# Patient Record
Sex: Male | Born: 1950 | Race: White | Hispanic: No | Smoking: Never smoker
Health system: Southern US, Community
[De-identification: ages and names within clinical notes are randomized; demographics above are authoritative.]

## PROBLEM LIST (undated history)

## (undated) DIAGNOSIS — I10 Essential (primary) hypertension: Secondary | ICD-10-CM

## (undated) DIAGNOSIS — Z951 Presence of aortocoronary bypass graft: Secondary | ICD-10-CM

## (undated) DIAGNOSIS — Z950 Presence of cardiac pacemaker: Secondary | ICD-10-CM

## (undated) DIAGNOSIS — I2510 Atherosclerotic heart disease of native coronary artery without angina pectoris: Secondary | ICD-10-CM

## (undated) HISTORY — PX: HAND SURGERY: SHX662

## (undated) HISTORY — PX: CORONARY ARTERY BYPASS GRAFT: SHX141

## (undated) HISTORY — PX: FOOT SURGERY: SHX648

---

## 2014-11-23 DIAGNOSIS — I10 Essential (primary) hypertension: Secondary | ICD-10-CM | POA: Diagnosis present

## 2014-11-23 DIAGNOSIS — I330 Acute and subacute infective endocarditis: Secondary | ICD-10-CM | POA: Insufficient documentation

## 2014-11-23 HISTORY — DX: Acute and subacute infective endocarditis: I33.0

## 2014-11-24 DIAGNOSIS — I5032 Chronic diastolic (congestive) heart failure: Secondary | ICD-10-CM | POA: Diagnosis present

## 2018-08-29 DIAGNOSIS — M009 Pyogenic arthritis, unspecified: Secondary | ICD-10-CM | POA: Insufficient documentation

## 2018-08-29 HISTORY — DX: Pyogenic arthritis, unspecified: M00.9

## 2018-11-21 DIAGNOSIS — Z950 Presence of cardiac pacemaker: Secondary | ICD-10-CM | POA: Diagnosis present

## 2019-04-23 DIAGNOSIS — E782 Mixed hyperlipidemia: Secondary | ICD-10-CM | POA: Diagnosis present

## 2021-02-01 DIAGNOSIS — I442 Atrioventricular block, complete: Secondary | ICD-10-CM | POA: Insufficient documentation

## 2021-05-16 ENCOUNTER — Observation Stay (HOSPITAL_COMMUNITY)
Admission: EM | Admit: 2021-05-16 | Discharge: 2021-05-18 | Disposition: A | Discharge status: Home / Self Care | Payer: Medicare Other | Attending: Internal Medicine | Admitting: Internal Medicine

## 2021-05-16 ENCOUNTER — Emergency Department (HOSPITAL_COMMUNITY): Payer: Medicare Other

## 2021-05-16 ENCOUNTER — Encounter (HOSPITAL_COMMUNITY): Payer: Self-pay

## 2021-05-16 ENCOUNTER — Other Ambulatory Visit: Payer: Self-pay

## 2021-05-16 DIAGNOSIS — Z8616 Personal history of COVID-19: Secondary | ICD-10-CM | POA: Insufficient documentation

## 2021-05-16 DIAGNOSIS — E782 Mixed hyperlipidemia: Secondary | ICD-10-CM | POA: Insufficient documentation

## 2021-05-16 DIAGNOSIS — Z950 Presence of cardiac pacemaker: Secondary | ICD-10-CM | POA: Insufficient documentation

## 2021-05-16 DIAGNOSIS — Z951 Presence of aortocoronary bypass graft: Secondary | ICD-10-CM | POA: Insufficient documentation

## 2021-05-16 DIAGNOSIS — Z6821 Body mass index (BMI) 21.0-21.9, adult: Secondary | ICD-10-CM | POA: Insufficient documentation

## 2021-05-16 DIAGNOSIS — I2510 Atherosclerotic heart disease of native coronary artery without angina pectoris: Secondary | ICD-10-CM | POA: Insufficient documentation

## 2021-05-16 DIAGNOSIS — R634 Abnormal weight loss: Secondary | ICD-10-CM | POA: Diagnosis not present

## 2021-05-16 DIAGNOSIS — N179 Acute kidney failure, unspecified: Secondary | ICD-10-CM | POA: Insufficient documentation

## 2021-05-16 DIAGNOSIS — E785 Hyperlipidemia, unspecified: Secondary | ICD-10-CM | POA: Insufficient documentation

## 2021-05-16 DIAGNOSIS — I5032 Chronic diastolic (congestive) heart failure: Secondary | ICD-10-CM | POA: Insufficient documentation

## 2021-05-16 DIAGNOSIS — I110 Hypertensive heart disease with heart failure: Secondary | ICD-10-CM | POA: Diagnosis not present

## 2021-05-16 DIAGNOSIS — R0989 Other specified symptoms and signs involving the circulatory and respiratory systems: Secondary | ICD-10-CM | POA: Insufficient documentation

## 2021-05-16 DIAGNOSIS — R0902 Hypoxemia: Secondary | ICD-10-CM | POA: Diagnosis not present

## 2021-05-16 DIAGNOSIS — R079 Chest pain, unspecified: Secondary | ICD-10-CM | POA: Diagnosis not present

## 2021-05-16 DIAGNOSIS — I080 Rheumatic disorders of both mitral and aortic valves: Secondary | ICD-10-CM | POA: Diagnosis not present

## 2021-05-16 DIAGNOSIS — R053 Chronic cough: Secondary | ICD-10-CM | POA: Insufficient documentation

## 2021-05-16 DIAGNOSIS — R9431 Abnormal electrocardiogram [ECG] [EKG]: Secondary | ICD-10-CM | POA: Insufficient documentation

## 2021-05-16 DIAGNOSIS — Z7982 Long term (current) use of aspirin: Secondary | ICD-10-CM | POA: Diagnosis not present

## 2021-05-16 DIAGNOSIS — I10 Essential (primary) hypertension: Secondary | ICD-10-CM | POA: Diagnosis present

## 2021-05-16 DIAGNOSIS — J984 Other disorders of lung: Secondary | ICD-10-CM | POA: Insufficient documentation

## 2021-05-16 DIAGNOSIS — I700 Atherosclerosis of aorta: Secondary | ICD-10-CM | POA: Diagnosis not present

## 2021-05-16 DIAGNOSIS — J129 Viral pneumonia, unspecified: Secondary | ICD-10-CM | POA: Diagnosis not present

## 2021-05-16 DIAGNOSIS — R0609 Other forms of dyspnea: Secondary | ICD-10-CM | POA: Diagnosis not present

## 2021-05-16 DIAGNOSIS — R001 Bradycardia, unspecified: Secondary | ICD-10-CM | POA: Diagnosis not present

## 2021-05-16 DIAGNOSIS — Z7901 Long term (current) use of anticoagulants: Secondary | ICD-10-CM | POA: Insufficient documentation

## 2021-05-16 DIAGNOSIS — Z79899 Other long term (current) drug therapy: Secondary | ICD-10-CM | POA: Insufficient documentation

## 2021-05-16 DIAGNOSIS — R0602 Shortness of breath: Secondary | ICD-10-CM | POA: Diagnosis present

## 2021-05-16 DIAGNOSIS — Z20822 Contact with and (suspected) exposure to covid-19: Secondary | ICD-10-CM | POA: Diagnosis not present

## 2021-05-16 DIAGNOSIS — J189 Pneumonia, unspecified organism: Secondary | ICD-10-CM | POA: Insufficient documentation

## 2021-05-16 HISTORY — DX: Presence of cardiac pacemaker: Z95.0

## 2021-05-16 HISTORY — DX: Essential (primary) hypertension: I10

## 2021-05-16 HISTORY — DX: Atherosclerotic heart disease of native coronary artery without angina pectoris: I25.10

## 2021-05-16 HISTORY — DX: Presence of aortocoronary bypass graft: Z95.1

## 2021-05-16 LAB — CBC
HCT: 44.3 % (ref 39.0–52.0)
Hemoglobin: 14.9 g/dL (ref 13.0–17.0)
MCH: 31 pg (ref 26.0–34.0)
MCHC: 33.6 g/dL (ref 30.0–36.0)
MCV: 92.3 fL (ref 80.0–100.0)
Platelets: 310 10*3/uL (ref 150–400)
RBC: 4.8 MIL/uL (ref 4.22–5.81)
RDW: 12.3 % (ref 11.5–15.5)
WBC: 14.1 10*3/uL — ABNORMAL HIGH (ref 4.0–10.5)
nRBC: 0 % (ref 0.0–0.2)

## 2021-05-16 LAB — COMPREHENSIVE METABOLIC PANEL
ALT: 14 U/L (ref 0–44)
AST: 21 U/L (ref 15–41)
Albumin: 3.7 g/dL (ref 3.5–5.0)
Alkaline Phosphatase: 112 U/L (ref 38–126)
Anion gap: 11 (ref 5–15)
BUN: 23 mg/dL (ref 8–23)
CO2: 18 mmol/L — ABNORMAL LOW (ref 22–32)
Calcium: 9.1 mg/dL (ref 8.9–10.3)
Chloride: 110 mmol/L (ref 98–111)
Creatinine, Ser: 1.69 mg/dL — ABNORMAL HIGH (ref 0.61–1.24)
GFR, Estimated: 43 mL/min — ABNORMAL LOW (ref >60)
Glucose, Bld: 103 mg/dL — ABNORMAL HIGH (ref 70–99)
Potassium: 4.6 mmol/L (ref 3.5–5.1)
Sodium: 139 mmol/L (ref 135–145)
Total Bilirubin: 1.1 mg/dL (ref 0.3–1.2)
Total Protein: 6.9 g/dL (ref 6.5–8.1)

## 2021-05-16 LAB — TROPONIN I (HIGH SENSITIVITY): Troponin I (High Sensitivity): 11 ng/L (ref <18)

## 2021-05-16 LAB — BRAIN NATRIURETIC PEPTIDE: B Natriuretic Peptide: 80.4 pg/mL (ref 0.0–100.0)

## 2021-05-16 NOTE — ED Triage Notes (Signed)
Pt reports sob for x1 month. Pt reports his sob is on & off. Pt reports 2 Cabg, pacemaker, 2 staph infections. Pt placed on 2L [place] in triage due to sp02 being 87% on RA ?

## 2021-05-16 NOTE — ED Provider Triage Note (Addendum)
Emergency Medicine Provider Triage Evaluation Note ? ?Ruben Donovan , a 71 y.o. male  was evaluated in triage.  Pt complains of SOB for the past 3-4 months that worsened today. Denies any chest pain or lightheadedness. Denies any leg swelling. H/o CABG 2 years ago. Never smoker. Has a pacemaker for bradycardia.  ? ?Review of Systems  ?Positive: SOB ?Negative: Chest pain, leg swelling ? ?Physical Exam  ?BP 124/89   Pulse 83   Temp 98.5 ?F (36.9 ?C) (Oral)   Resp (!) 30   Ht 5\' 5"  (1.651 m)   Wt 59.9 kg   SpO2 98%   BMI 21.97 kg/m?  ?Gen:   Awake, uncomfortable ?Resp:  Tachypnea, dropped down to 80%s with ambulation. Diminished at the bases.  ?MSK:   Moves extremities without difficulty  ?Other:   ? ?Medical Decision Making  ?Medically screening exam initiated at 9:53 PM.  Appropriate orders placed.  Sebastiaan Eichenauer was informed that the remainder of the evaluation will be completed by another provider, this initial triage assessment does not replace that evaluation, and the importance of remaining in the ED until their evaluation is complete. ? ?Labs and imaging ordered. The patient needs to roomed now.  ?  ?Sherrell Puller, PA-C ?05/16/21 2156 ? ?  ?Sherrell Puller, PA-C ?05/16/21 2157 ? ?  ?Sherrell Puller, PA-C ?05/16/21 2157 ? ?

## 2021-05-17 ENCOUNTER — Encounter (HOSPITAL_COMMUNITY): Payer: Self-pay | Admitting: Internal Medicine

## 2021-05-17 ENCOUNTER — Emergency Department (HOSPITAL_COMMUNITY): Payer: Medicare Other

## 2021-05-17 ENCOUNTER — Observation Stay (HOSPITAL_BASED_OUTPATIENT_CLINIC_OR_DEPARTMENT_OTHER): Payer: Medicare Other

## 2021-05-17 DIAGNOSIS — I5032 Chronic diastolic (congestive) heart failure: Secondary | ICD-10-CM

## 2021-05-17 DIAGNOSIS — R0609 Other forms of dyspnea: Secondary | ICD-10-CM

## 2021-05-17 DIAGNOSIS — Z950 Presence of cardiac pacemaker: Secondary | ICD-10-CM | POA: Diagnosis not present

## 2021-05-17 DIAGNOSIS — R0902 Hypoxemia: Secondary | ICD-10-CM

## 2021-05-17 DIAGNOSIS — I10 Essential (primary) hypertension: Secondary | ICD-10-CM

## 2021-05-17 DIAGNOSIS — N179 Acute kidney failure, unspecified: Secondary | ICD-10-CM

## 2021-05-17 DIAGNOSIS — Z951 Presence of aortocoronary bypass graft: Secondary | ICD-10-CM

## 2021-05-17 LAB — RESP PANEL BY RT-PCR (FLU A&B, COVID) ARPGX2
Influenza A by PCR: NEGATIVE
Influenza B by PCR: NEGATIVE
SARS Coronavirus 2 by RT PCR: NEGATIVE

## 2021-05-17 LAB — RESPIRATORY PANEL BY PCR

## 2021-05-17 LAB — ECHOCARDIOGRAM COMPLETE
AR max vel: 1.9 cm2
AV Area VTI: 2.05 cm2
AV Area mean vel: 1.88 cm2
AV Mean grad: 4 mmHg
AV Peak grad: 9.1 mmHg
Ao pk vel: 1.51 m/s
Area-P 1/2: 3.93 cm2
Height: 65 in
S' Lateral: 3.5 cm
Weight: 2112 oz

## 2021-05-17 LAB — TROPONIN I (HIGH SENSITIVITY): Troponin I (High Sensitivity): 11 ng/L (ref <18)

## 2021-05-17 MED ORDER — IOHEXOL 350 MG/ML SOLN
70.0000 mL | Freq: Once | INTRAVENOUS | Status: AC | PRN
  Administered 2021-05-17: 70 mL via INTRAVENOUS

## 2021-05-17 MED ORDER — ASPIRIN EC 81 MG PO TBEC
81.0000 mg | DELAYED_RELEASE_TABLET | Freq: Every day | ORAL | Status: DC
  Administered 2021-05-17 – 2021-05-18 (×2): 81 mg via ORAL
  Filled 2021-05-17 (×2): qty 1

## 2021-05-17 MED ORDER — AMLODIPINE BESYLATE 5 MG PO TABS
5.0000 mg | ORAL_TABLET | Freq: Every day | ORAL | Status: DC
  Administered 2021-05-17 – 2021-05-18 (×2): 5 mg via ORAL
  Filled 2021-05-17 (×2): qty 1

## 2021-05-17 MED ORDER — ACETAMINOPHEN 325 MG PO TABS
650.0000 mg | ORAL_TABLET | Freq: Four times a day (QID) | ORAL | Status: DC | PRN
Start: 2021-05-17 — End: 2021-05-18

## 2021-05-17 MED ORDER — ONDANSETRON HCL 4 MG/2ML IJ SOLN: 4.0000 mg | Freq: Four times a day (QID) | INTRAMUSCULAR | Status: DC | PRN

## 2021-05-17 MED ORDER — DOXYCYCLINE HYCLATE 100 MG PO TABS
100.0000 mg | ORAL_TABLET | Freq: Two times a day (BID) | ORAL | Status: DC
  Administered 2021-05-17 – 2021-05-18 (×3): 100 mg via ORAL
  Filled 2021-05-17 (×3): qty 1

## 2021-05-17 MED ORDER — ONDANSETRON HCL 4 MG PO TABS: 4.0000 mg | ORAL_TABLET | Freq: Four times a day (QID) | ORAL | Status: DC | PRN

## 2021-05-17 MED ORDER — HYDROXYZINE HCL 25 MG PO TABS
25.0000 mg | ORAL_TABLET | Freq: Every day | ORAL | Status: DC
Start: 2021-05-17 — End: 2021-05-18
  Administered 2021-05-17: 25 mg via ORAL
  Filled 2021-05-17: qty 1

## 2021-05-17 MED ORDER — SODIUM CHLORIDE 0.9 % IV SOLN
1.0000 g | Freq: Once | INTRAVENOUS | Status: AC
  Administered 2021-05-17: 1 g via INTRAVENOUS
  Filled 2021-05-17: qty 10

## 2021-05-17 MED ORDER — SODIUM CHLORIDE 0.9 % IV BOLUS
500.0000 mL | Freq: Once | INTRAVENOUS | Status: AC
  Administered 2021-05-17: 500 mL via INTRAVENOUS

## 2021-05-17 MED ORDER — IPRATROPIUM-ALBUTEROL 0.5-2.5 (3) MG/3ML IN SOLN
3.0000 mL | Freq: Four times a day (QID) | RESPIRATORY_TRACT | Status: DC
  Administered 2021-05-17: 3 mL via RESPIRATORY_TRACT
  Filled 2021-05-17: qty 3

## 2021-05-17 MED ORDER — HEPARIN SODIUM (PORCINE) 5000 UNIT/ML IJ SOLN
5000.0000 [IU] | Freq: Three times a day (TID) | INTRAMUSCULAR | Status: DC
  Administered 2021-05-17 – 2021-05-18 (×4): 5000 [IU] via SUBCUTANEOUS
  Filled 2021-05-17 (×4): qty 1

## 2021-05-17 MED ORDER — IPRATROPIUM-ALBUTEROL 0.5-2.5 (3) MG/3ML IN SOLN
3.0000 mL | Freq: Two times a day (BID) | RESPIRATORY_TRACT | Status: DC
  Administered 2021-05-17 – 2021-05-18 (×2): 3 mL via RESPIRATORY_TRACT
  Filled 2021-05-17 (×2): qty 3

## 2021-05-17 MED ORDER — LACTATED RINGERS IV SOLN: INTRAVENOUS | Status: DC

## 2021-05-17 MED ORDER — CARVEDILOL 12.5 MG PO TABS
12.5000 mg | ORAL_TABLET | Freq: Two times a day (BID) | ORAL | Status: DC
  Administered 2021-05-17 – 2021-05-18 (×3): 12.5 mg via ORAL
  Filled 2021-05-17: qty 4
  Filled 2021-05-17 (×2): qty 1

## 2021-05-17 MED ORDER — ACETAMINOPHEN 650 MG RE SUPP: 650.0000 mg | Freq: Four times a day (QID) | RECTAL | Status: DC | PRN

## 2021-05-17 MED ORDER — AZITHROMYCIN 500 MG IV SOLR
500.0000 mg | Freq: Once | INTRAVENOUS | Status: AC
  Administered 2021-05-17: 500 mg via INTRAVENOUS
  Filled 2021-05-17: qty 5

## 2021-05-17 MED ORDER — ATORVASTATIN CALCIUM 40 MG PO TABS
40.0000 mg | ORAL_TABLET | Freq: Every day | ORAL | Status: DC
  Administered 2021-05-17 – 2021-05-18 (×2): 40 mg via ORAL
  Filled 2021-05-17 (×2): qty 1

## 2021-05-17 MED ORDER — FOLIC ACID 1 MG PO TABS
1.0000 mg | ORAL_TABLET | Freq: Every day | ORAL | Status: DC
  Administered 2021-05-17 – 2021-05-18 (×2): 1 mg via ORAL
  Filled 2021-05-17 (×2): qty 1

## 2021-05-17 NOTE — Assessment & Plan Note (Signed)
Chronic. 

## 2021-05-17 NOTE — Assessment & Plan Note (Signed)
Check echo.  BNP is slightly up from his labs last month (but still within normal limits) but certainly does not have any pulmonary edema.  He has no JVD.  He is euvolemic.  He has no peripheral edema. ?

## 2021-05-17 NOTE — Subjective & Objective (Signed)
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  He has noticed increasing shortness of breath with even simple activities.  [place].  He got so short of breath that he had his friend [place] [facility]. ? ?While walking from the parking lot into the triage area, he dropped his ambulatory saturations down to 80%.  He was placed on 2 L of oxygen. ? ?He denies any chest pain or chest discomfort with walking.  He had a CABG 2 years ago.  He has never been a smoker.  He used to work for the [employer] in [employer]  He denies breathing any noxious fumes.  He is never worked with asbestos.  He does relate that he had COVID in August 2022. ? ?His most recent labs were from March 2023 ? ?05/13/2021 Glucose 82 mg/dL 70-99 mg/dL  ?  Bun 13 mg/dL 8-27 mg/dL  ?  Creatinine 1.18 mg/dL 0.76-1.27 mg/dL  ?  Egfr 66 mL/min/1.73 >59 mL/min/1.73  ?  BUN/creatinine Ratio 11 10-24  ?  Sodium 138 mmol/L 134-144 mmol/L  ?  Potassium 4.5 mmol/L 3.5-5.2 mmol/L  ?  Chloride 104 mmol/L 96-106 mmol/L  ?  Carbon Dioxide, Total 20 mmol/L 20-29 mmol/L  ?05/13/2021 B-type Natriuretic Peptide 34.9 pg/mL 0.0-100.0 pg/mL  ? ? ?05/13/2021 Wbc 7.3 x10e3/uL 3.4-10.8 x10e3/uL  ?  Rbc 5.16 x10e6/uL 4.14-5.80 x10e6/uL  ?  Hemoglobin 16.0 g/dL 13.0-17.7 g/dL  ?  Hematocrit 45.8 % 37.5-51.0 %  ?  Mcv 89 fL 79-97 fL  ?  Mch 31.0 pg 26.6-33.0 pg  ?  Mchc 34.9 g/dL 31.5-35.7 g/dL  ?  Rdw 11.9 % 11.6-15.4 %  ?  Platelets 282 x10e3/uL 150-450 x10e3/uL  ? ?Vital signs on admission to the ER, temperature 98.5 heart rate 83 blood pressure 124/89 satting 90% on 2 L. ? ?Labs showed a white  count of 14.1, hemoglobin 14.9, platelets of 310 ? ?Sodium 139 potassium 4.6 bicarb 18, BUN of 23, creatinine 1.69 ? ?BNP of 80 ? ?COVID-negative flu negative ? ?CTPA was negative for PE.  There is some groundglass opacities in the left lower lobe and the small airways and some similar clustered nodules in the right middle lobe.  Does not appear to be pneumonia.  On my read. ? ?EKG shows paced rhythm ? ?Due to the patient's dyspnea exertion and amatory hypoxia, [facility] hospitalist contacted for admission. ? ? ? ?

## 2021-05-17 NOTE — ED Provider Notes (Signed)
?[facility] ?Provider Note ? ? ?CSN: [id number] ?Arrival date & time: 05/16/21  2035 ? ?  ? ?History ? ?Chief Complaint  ?Patient presents with  ? Shortness of Breath  ? Cough  ? ? ?Ruben Donovan is a 71 y.o. male. ? ?Patient presents to the emergency department for evaluation of shortness of breath.  Patient reports that he had COVID in August and has had some residual symptoms.  He has been experiencing increased shortness of breath for the last several months.  In the last 24 hours, however, symptoms have significantly worsened.  Patient reports significant exertional shortness of breath without chest pain.  He does have a significant cardiac history including bypass surgery.  Patient reports persistent cough for the last several months, nonproductive.  No significant change in the cough. ? ? ?  ? ?Home Medications ?Prior to Admission medications   ?Medication Sig Start Date End Date Taking? Authorizing Provider  ?albuterol (VENTOLIN HFA) 108 (90 Base) MCG/ACT inhaler Inhale 2 puffs into the lungs every 4 (four) hours as needed for wheezing or shortness of breath. 05/13/21  Yes [provider]  ?amLODipine (NORVASC) 5 MG tablet Take 5 mg by mouth daily. 05/05/21  Yes [provider]  ?aspirin 81 MG EC tablet Take 81 mg by mouth daily.   Yes [provider]  ?atorvastatin (LIPITOR) 40 MG tablet Take 40 mg by mouth daily. 02/22/21  Yes [provider]  ?azelastine (ASTELIN) 0.1 % nasal spray Place 2 sprays into both nostrils 2 (two) times daily as needed for rhinitis or allergies. 02/01/21  Yes [provider]  ?carvedilol (COREG) 12.5 MG tablet Take 12.5 mg by mouth 2 (two) times daily. 05/10/21  Yes [provider]  ?Emollient (CERAVE EX) Apply 1 application. topically as needed (dry skin).   Yes [provider]  ?folic acid (FOLVITE) 1 MG tablet Take 1 mg by mouth daily. 03/08/21  Yes [provider]   ?hydrOXYzine (ATARAX) 25 MG tablet Take 25 mg by mouth at bedtime. 03/31/21  Yes [provider]  ?lisinopril (ZESTRIL) 30 MG tablet Take 30 mg by mouth daily. 03/17/21  Yes [provider]  ?triamcinolone cream (KENALOG) 0.1 % Apply 1 application. topically 2 (two) times daily as needed (rash).   Yes [provider]  ?   ? ?Allergies    ?Clonidine derivatives and Hydralazine   ? ?Review of Systems   ?Review of Systems  ?Respiratory:  Positive for cough and shortness of breath.   ? ?Physical Exam ?Updated Vital Signs ?BP (!) 142/62   Pulse 65   Temp 98.5 ?F (36.9 ?C) (Oral)   Resp (!) 24   Ht 5\' 5"  (1.651 m)   Wt 59.9 kg   SpO2 99%   BMI 21.97 kg/m?  ?Physical Exam ?Vitals and nursing note reviewed.  ?Constitutional:   ?   General: He is not in acute distress. ?   Appearance: He is well-developed.  ?HENT:  ?   Head: Normocephalic and atraumatic.  ?   Mouth/Throat:  ?   Mouth: Mucous membranes are moist.  ?Eyes:  ?   General: Vision grossly intact. Gaze aligned appropriately.  ?   Extraocular Movements: Extraocular movements intact.  ?   Conjunctiva/sclera: Conjunctivae normal.  ?Cardiovascular:  ?   Rate and Rhythm: Normal rate and regular rhythm.  ?   Pulses: Normal pulses.  ?   Heart sounds: Normal heart sounds, S1 normal and S2 normal. No  murmur heard. ?  No friction rub. No gallop.  ?Pulmonary:  ?   Effort: Tachypnea and accessory muscle usage present. No respiratory distress.  ?   Breath sounds: Normal breath sounds.  ?Abdominal:  ?   Palpations: Abdomen is soft.  ?   Tenderness: There is no abdominal tenderness. There is no guarding or rebound.  ?   Hernia: No hernia is present.  ?Musculoskeletal:     ?   General: No swelling.  ?   Cervical back: Full passive range of motion without pain, normal range of motion and neck supple. No pain with movement, spinous process tenderness or muscular tenderness. Normal range of motion.  ?   Right lower leg: No edema.  ?   Left lower leg: No  edema.  ?Skin: ?   General: Skin is warm and dry.  ?   Capillary Refill: Capillary refill takes less than 2 seconds.  ?   Findings: No ecchymosis, erythema, lesion or wound.  ?Neurological:  ?   Mental Status: He is alert and oriented to person, place, and time.  ?   GCS: GCS eye subscore is 4. GCS verbal subscore is 5. GCS motor subscore is 6.  ?   Cranial Nerves: Cranial nerves 2-12 are intact.  ?   Sensory: Sensation is intact.  ?   Motor: Motor function is intact. No weakness or abnormal muscle tone.  ?   Coordination: Coordination is intact.  ?Psychiatric:     ?   Mood and Affect: Mood normal.     ?   Speech: Speech normal.     ?   Behavior: Behavior normal.  ? ? ?ED Results / Procedures / Treatments   ?Labs ?(all labs ordered are listed, but only abnormal results are displayed) ?Labs Reviewed  ?CBC - Abnormal; Notable for the following components:  ?    Result Value  ? WBC 14.1 (*)   ? All other components within normal limits  ?COMPREHENSIVE METABOLIC PANEL - Abnormal; Notable for the following components:  ? CO2 18 (*)   ? Glucose, Bld 103 (*)   ? Creatinine, Ser 1.69 (*)   ? GFR, Estimated 43 (*)   ? All other components within normal limits  ?RESP PANEL BY RT-PCR (FLU A&B, COVID) [id number]  ?BRAIN NATRIURETIC PEPTIDE  ?TROPONIN I (HIGH SENSITIVITY)  ?TROPONIN I (HIGH SENSITIVITY)  ? ? ?EKG ?EKG Interpretation ? ?Date/Time:  Monday May 16 2021 21:39:01 EDT ?Ventricular Rate:  79 ?PR Interval:  154 ?QRS Duration: 166 ?QT Interval:  454 ?QTC Calculation: 520 ?R Axis:   228 ?Text Interpretation: Atrial-sensed ventricular-paced rhythm Abnormal ECG No previous ECGs available Confirmed by Gilda Crease [phone]) on 05/17/2021 12:29:55 AM ? ?Radiology ?DG Chest 2 View ? ?Result Date: 05/16/2021 ?CLINICAL DATA:  Chest pain EXAM: CHEST - 2 VIEW COMPARISON:  None. FINDINGS: Right-sided multi lead pacing device. Post sternotomy changes. No focal opacity, pleural effusion or pneumothorax. Normal cardiac size.  Symmetrical lower lung opacities likely reflect nipple shadows IMPRESSION: No active cardiopulmonary disease. Electronically Signed   By: Jasmine Pang M.D.   On: 05/16/2021 22:39  ? ?CT Angio Chest Pulmonary Embolism (PE) W or WO Contrast ? ?Result Date: 05/17/2021 ?CLINICAL DATA:  Pulmonary embolism (PE) suspected, high prob EXAM: CT ANGIOGRAPHY CHEST WITH CONTRAST TECHNIQUE: Multidetector CT imaging of the chest was performed using the standard protocol during bolus administration of intravenous contrast. Multiplanar CT image reconstructions and MIPs were obtained to evaluate the vascular anatomy. RADIATION  DOSE REDUCTION: This exam was performed according to the departmental dose-optimization program which includes automated exposure control, adjustment of the mA and/or kV according to patient size and/or use of iterative reconstruction technique. CONTRAST:  20mL OMNIPAQUE IOHEXOL 350 MG/ML SOLN COMPARISON:  None. FINDINGS: Cardiovascular: No filling defects in the pulmonary arteries to suggest pulmonary emboli. Pacer wires noted in the right heart. Heart is borderline in size. Prior CABG. Scattered aortic calcifications. No aneurysm. Mediastinum/Nodes: No mediastinal, hilar, or axillary adenopathy. Trachea and esophagus are unremarkable. Thyroid unremarkable. Lungs/Pleura: Clustered nodular airspace disease and ground-glass disease in the left lower lobe, likely small airways disease. Similar clustered nodules in the right middle lobe. No effusions. Upper Abdomen: Imaging into the upper abdomen demonstrates no acute findings. Musculoskeletal: Chest wall soft tissues are unremarkable. No acute bony abnormality. Review of the MIP images confirms the above findings. IMPRESSION: No evidence of pulmonary embolus. Clustered nodular densities and ground-glass densities in the left lower lobe and right middle lobe compatible with small airways disease. Prior CABG. Aortic Atherosclerosis (ICD10-I70.0). Electronically  Signed   By: Charlett Nose M.D.   On: 05/17/2021 02:42   ? ?Procedures ?Procedures  ? ? ?Medications Ordered in ED ?Medications  ?cefTRIAXone (ROCEPHIN) 1 g in sodium chloride 0.9 % 100 mL IVPB (has no administration in time rang

## 2021-05-17 NOTE — Assessment & Plan Note (Signed)
Stable.  Continue aspirin and beta-blocker.  Continue statin. ?

## 2021-05-17 NOTE — Assessment & Plan Note (Signed)
Gently hydrate him.  Hold ACE inhibitor. ?

## 2021-05-17 NOTE — H&P (Signed)
?History and Physical  ? ? ?Ruben Donovan GLO:[id number] DOB: May 24, 1950 DOA: 05/16/2021 ? ?DOS: the patient was seen and examined on 05/16/2021 ? ?PCP: Pcp, No  ? ?Patient coming from: Home ? ?I have personally briefly reviewed patient's old medical records in [place] ? 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  He has noticed increasing shortness of breath with even simple activities.  [place]  He got so short of breath that he had his friend [place] [facility]. ? ?While walking from the parking lot into the triage area, he dropped his ambulatory saturations down to 80%.  He was placed on 2 L of oxygen. ? ?He denies any chest pain or chest discomfort with walking.  He had a CABG 2 years ago.  He has never been a smoker.  He used to work for the [employer] in [employer]  He denies breathing any noxious fumes.  He is never worked with asbestos.  He does relate that he had COVID in August 2022. ? ?His most recent labs were from March 2023 ? ?05/13/2021 Glucose 82 mg/dL 70-99 mg/dL  ?  Bun 13 mg/dL 8-27 mg/dL  ?  Creatinine 1.18 mg/dL 0.76-1.27 mg/dL  ?  Egfr 66 mL/min/1.73 >59 mL/min/1.73  ?  BUN/creatinine Ratio 11 10-24  ?  Sodium 138 mmol/L 134-144 mmol/L  ?  Potassium 4.5 mmol/L 3.5-5.2 mmol/L  ?  Chloride 104 mmol/L 96-106 mmol/L  ?  Carbon Dioxide, Total 20 mmol/L 20-29 mmol/L  ?05/13/2021 B-type Natriuretic Peptide 34.9 pg/mL 0.0-100.0 pg/mL  ? ? ?05/13/2021 Wbc 7.3 x10e3/uL 3.4-10.8 x10e3/uL  ?  Rbc 5.16 x10e6/uL 4.14-5.80 x10e6/uL  ?  Hemoglobin 16.0 g/dL 13.0-17.7 g/dL  ?  Hematocrit 45.8 % 37.5-51.0 %  ?  Mcv 89 fL 79-97  fL  ?  Mch 31.0 pg 26.6-33.0 pg  ?  Mchc 34.9 g/dL 31.5-35.7 g/dL  ?  Rdw 11.9 % 11.6-15.4 %  ?  Platelets 282 x10e3/uL 150-450 x10e3/uL  ? ?Vital signs on admission to the ER, temperature 98.5 heart rate 83 blood pressure 124/89 satting 90% on 2 L. ? ?Labs showed a white count of 14.1, hemoglobin 14.9, platelets of 310 ? ?Sodium 139 potassium 4.6 bicarb 18, BUN of 23, creatinine 1.69 ? ?BNP of 80 ? ?COVID-negative flu negative ? ?CTPA was negative for PE.  There is some groundglass opacities in the left lower lobe and the small airways and some similar clustered nodules in the right middle lobe.  Does not appear to be pneumonia.  On my read. ? ?EKG shows paced rhythm ? ?Due to the patient's dyspnea exertion and amatory hypoxia, [facility] hospitalist contacted for admission. ? ? ?  ? ?ED Course: Noted to be hypoxic walk from the parking lot to triage.  Sats dropped to the 80s.  Placed on 2 L of oxygen.  Work-up of his dyspnea really unrevealing.  Mild AKI.  CTPA negative for pneumonia and PE. ? ?Review of Systems:  ?Review of Systems  ?Constitutional:  Positive for malaise/fatigue.  ?HENT: Negative.    ?Eyes: Negative.   ?Respiratory:  Positive for cough and shortness of breath.   ?  Consistent dyspnea on exertion.  Only episodic dyspnea at rest.  Occasionally productive of clear sputum.  Denies any hemoptysis.  No night sweats.  ?Cardiovascular:  Negative for chest pain, palpitations, orthopnea, leg swelling and PND.  ?Gastrointestinal: Negative.   ?Genitourinary: Negative.   ?Musculoskeletal: Negative.   ?Skin: Negative.   ?Neurological: Negative.   ?Endo/Heme/Allergies: Negative.   ?Psychiatric/Behavioral: Negative.    ?All other systems reviewed and are negative. ? ?Past Medical History:  ?Diagnosis Date  ? 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 CAD (coronary artery disease), native coronary artery   ? Hx of CABG   ? Hypertension   ? Pacemaker   ? Septic arthritis of left ankle ([place]) 08/29/2018  ? ? ?Past  Surgical History:  ?Procedure Laterality Date  ? CORONARY ARTERY BYPASS GRAFT    ? ? ? reports that he has never smoked. He has never used smokeless tobacco. He reports that he does not currently use alcohol. He reports that he does not use drugs. ? ?Allergies  ?Allergen Reactions  ? Clonidine Derivatives   ? Hydralazine   ? ? ?History reviewed. No pertinent family history. ? ?Prior to Admission medications   ?Medication Sig Start Date End Date Taking? Authorizing Provider  ?albuterol (VENTOLIN HFA) 108 (90 Base) MCG/ACT inhaler Inhale 2 puffs into the lungs every 4 (four) hours as needed for wheezing or shortness of breath. 05/13/21  Yes [provider]  ?amLODipine (NORVASC) 5 MG tablet Take 5 mg by mouth daily. 05/05/21  Yes [provider]  ?aspirin 81 MG EC tablet Take 81 mg by mouth daily.   Yes [provider]  ?atorvastatin (LIPITOR) 40 MG tablet Take 40 mg by mouth daily. 02/22/21  Yes [provider]  ?azelastine (ASTELIN) 0.1 % nasal spray Place 2 sprays into both nostrils 2 (two) times daily as needed for rhinitis or allergies. 02/01/21  Yes [provider]  ?carvedilol (COREG) 12.5 MG tablet Take 12.5 mg by mouth 2 (two) times daily. 05/10/21  Yes [provider]  ?Emollient (CERAVE EX) Apply 1 application. topically as needed (dry skin).   Yes [provider]  ?folic acid (FOLVITE) 1 MG tablet Take 1 mg by mouth daily. 03/08/21  Yes [provider]  ?hydrOXYzine (ATARAX) 25 MG tablet Take 25 mg by mouth at bedtime. 03/31/21  Yes [provider]  ?lisinopril (ZESTRIL) 30 MG tablet Take 30 mg by mouth daily. 03/17/21  Yes [provider]  ?triamcinolone cream (KENALOG) 0.1 % Apply 1 application. topically 2 (two) times daily as needed (rash).   Yes [provider]  ? ? ?Physical Exam: ?Vitals:  ? 05/17/21 0400 05/17/21 0415 05/17/21 0430 05/17/21 0445  ?BP: 126/71  (!) 142/74   ?Pulse: 66 65 65 65  ?Resp: 12 (!) 32  (!) 32 (!) 25  ?Temp:      ?TempSrc:      ?SpO2: 100% 100% 100% 100%  ?Weight:      ?Height:      ? ? ?Physical Exam ?Vitals and nursing note reviewed.  ?Constitutional:   ?   General: He is not in acute distress. ?   Appearance: Normal appearance. He is normal weight. He is not ill-appearing, toxic-appearing or diaphoretic.  ?HENT:  ?   Head: Normocephalic and atraumatic.  ?   Nose: Nose normal. No rhinorrhea.  ?Cardiovascular:  ?   Rate and Rhythm: Normal rate and regular rhythm.  ?   Pulses: Normal pulses.  ?   Heart  sounds: No murmur heard. ?Pulmonary:  ?   Effort: Pulmonary effort is normal. No tachypnea.  ?   Breath sounds: Examination of the right-upper field reveals wheezing. Examination of the left-upper field reveals wheezing. Wheezing present.  ?   Comments: .Scattered wheezes in the upper lobes.  No respiratory distress ?Abdominal:  ?   General: Abdomen is flat. Bowel sounds are normal. There is no distension.  ?   Palpations: Abdomen is soft.  ?   Tenderness: There is no abdominal tenderness. There is no guarding or rebound.  ?Musculoskeletal:  ?   Comments: Pacemaker right anterior chest wall.  ?Skin: ?   General: Skin is warm and dry.  ?   Capillary Refill: Capillary refill takes less than 2 seconds.  ?Neurological:  ?   General: No focal deficit present.  ?   Mental Status: He is alert and oriented to person, place, and time.  ?  ? ?Labs on Admission: I have personally reviewed following labs and imaging studies ? ?CBC: ?Recent Labs  ?Lab 05/16/21 ?2149  ?WBC 14.1*  ?HGB 14.9  ?HCT 44.3  ?MCV 92.3  ?PLT 310  ? ?Basic Metabolic Panel: ?Recent Labs  ?Lab 05/16/21 ?2157  ?NA 139  ?K 4.6  ?CL 110  ?CO2 18*  ?GLUCOSE 103*  ?BUN 23  ?CREATININE 1.69*  ?CALCIUM 9.1  ? ?GFR: ?Estimated Creatinine Clearance: 34.5 mL/min (A) (by C-G formula based on SCr of 1.69 mg/dL (H)). ?Liver Function Tests: ?Recent Labs  ?Lab 05/16/21 ?2157  ?AST 21  ?ALT 14  ?ALKPHOS 112  ?BILITOT 1.1  ?PROT 6.9  ?ALBUMIN 3.7  ? ?No  results for input(s): LIPASE, AMYLASE in the last 168 hours. ?No results for input(s): AMMONIA in the last 168 hours. ?Coagulation Profile: ?No results for input(s): INR, PROTIME in the last 168 hours. ?Card

## 2021-05-17 NOTE — Progress Notes (Signed)
Patient admitted earlier this morning.  H&P reviewed.  Patient seen and examined. ? ?S: ?Patient complains of dry cough.  Symptoms of shortness of breath especially with exertion have been ongoing for past at least 1 to 3 months.  Slight worsening noted recently.  Denies any fever or chills.  No chest pain.  No leg swelling.  Does mention weight loss of about 10 pounds in the last 2 weeks. ? ?O: ?Noted to be afebrile.  Other vital signs are stable.  He was saturating in the early 80s on room air at initial assessment.  Currently saturations are in the late 90s on 2 L of oxygen by nasal cannula. ? ?Coarse air entry at the bases bilaterally with few crackles on the left base.  No wheezing or rhonchi.  Normal effort at rest. ?S1-S2 is normal regular.  No S3-S4.  No rubs murmurs or bruit ?Abdomen is soft.  Nontender nondistended. ?No lower extremity edema noted. ? ?WBC noted to be 14.1.  Procalcitonin is pending.   COVID-19 test was negative.  Influenza PCR negative.   ? ?Respiratory viral panel interestingly positive for adenovirus. ? ?CT angiogram report reviewed. ? ?A/P: ?Reason for patient's symptoms is not entirely clear.  His somewhat subacute symptoms may have been made worse by acute adenovirus infection.  Follow-up on echocardiogram which has been ordered.  Hypersensitivity pneumonitis panel has been ordered.   ?PFTs have been ordered which should ideally be done in the outpatient setting.  The adenovirus infection may bias the results.  We will cancel the study for now.   ? ?There could be secondary bacterial infection.  We will place him on doxycycline.   ? ?Depending on results of echocardiogram and his clinical progress may be reasonable to involve pulmonology.  If he improves in the next 24 hours then this can be pursued in the outpatient setting. ? ?He may need home oxygen.  We will determine that in the next 24 to 48 hours. ? ?Other medical problems as outlined in the H&P.  He is being hydrated.  Recheck  labs tomorrow.  Monitor urine output. ? ?Ruben Donovan ?05/17/2021 ? ?

## 2021-05-17 NOTE — Progress Notes (Signed)
?  Transition of Care (TOC) Screening Note ? ? ?Patient Details  ?Name: Ruben Donovan ?Date of Birth: 09/10/1950 ? ? ?Transition of Care (TOC) CM/SW Contact:    ?Mearl Latin, LCSW ?Phone Number: ?05/17/2021, 5:02 PM ? ? ? ?Transition of Care Department [facility]) has reviewed patient and no TOC needs have been identified at this time. We will continue to monitor patient advancement through interdisciplinary progression rounds. If new patient transition needs arise, please place a TOC consult. ? ? ?

## 2021-05-17 NOTE — Progress Notes (Signed)
Echocardiogram ?2D Echocardiogram has been performed. ? ?Arlyss Gandy ?05/17/2021, 10:52 AM ?

## 2021-05-17 NOTE — Assessment & Plan Note (Signed)
Patient has ambulatory hypoxia.  May need home oxygen.  Again, check full PFTs with [facility]. ?

## 2021-05-17 NOTE — Assessment & Plan Note (Signed)
Continue blood pressure medicines except for ACE inhibitor. ?

## 2021-05-17 NOTE — Assessment & Plan Note (Signed)
-  Continue Lipitor °

## 2021-05-17 NOTE — Assessment & Plan Note (Addendum)
Assigned to observation telemetry bed.  Patient does not have pneumonia.  Has no fever.  Not sure what to make of a white count of 14.  Check procalcitonin.  Do not continue antibiotics for now.  Check respiratory viral panel.  Order PFTs with [facility].  Check echo.  Check hypersensitivity pneumonitis panel.  May need pulmonary consultation either as an inpatient or as an outpatient. ?

## 2021-05-18 ENCOUNTER — Other Ambulatory Visit (HOSPITAL_COMMUNITY): Payer: Self-pay

## 2021-05-18 DIAGNOSIS — R0609 Other forms of dyspnea: Secondary | ICD-10-CM | POA: Diagnosis not present

## 2021-05-18 DIAGNOSIS — J129 Viral pneumonia, unspecified: Secondary | ICD-10-CM

## 2021-05-18 DIAGNOSIS — N179 Acute kidney failure, unspecified: Secondary | ICD-10-CM | POA: Diagnosis not present

## 2021-05-18 DIAGNOSIS — I5032 Chronic diastolic (congestive) heart failure: Secondary | ICD-10-CM | POA: Diagnosis not present

## 2021-05-18 LAB — PROCALCITONIN: Procalcitonin: <0.1 ng/mL

## 2021-05-18 LAB — C-REACTIVE PROTEIN: CRP: 1.9 mg/dL — ABNORMAL HIGH (ref <1.0)

## 2021-05-18 LAB — COMPREHENSIVE METABOLIC PANEL
ALT: 12 U/L (ref 0–44)
AST: 16 U/L (ref 15–41)
Albumin: 2.8 g/dL — ABNORMAL LOW (ref 3.5–5.0)
Alkaline Phosphatase: 89 U/L (ref 38–126)
Anion gap: 5 (ref 5–15)
BUN: 18 mg/dL (ref 8–23)
CO2: 20 mmol/L — ABNORMAL LOW (ref 22–32)
Calcium: 8.7 mg/dL — ABNORMAL LOW (ref 8.9–10.3)
Chloride: 111 mmol/L (ref 98–111)
Creatinine, Ser: 1.14 mg/dL (ref 0.61–1.24)
GFR, Estimated: >60 mL/min (ref >60)
Glucose, Bld: 85 mg/dL (ref 70–99)
Potassium: 4.6 mmol/L (ref 3.5–5.1)
Sodium: 136 mmol/L (ref 135–145)
Total Bilirubin: 0.6 mg/dL (ref 0.3–1.2)
Total Protein: 5.4 g/dL — ABNORMAL LOW (ref 6.5–8.1)

## 2021-05-18 LAB — CBC WITH DIFFERENTIAL/PLATELET
Abs Immature Granulocytes: 0.02 10*3/uL (ref 0.00–0.07)
Basophils Absolute: 0 10*3/uL (ref 0.0–0.1)
Basophils Relative: 1 %
Eosinophils Absolute: 0.3 10*3/uL (ref 0.0–0.5)
Eosinophils Relative: 6 %
HCT: 40.4 % (ref 39.0–52.0)
Hemoglobin: 13.4 g/dL (ref 13.0–17.0)
Immature Granulocytes: 0 %
Lymphocytes Relative: 28 %
Lymphs Abs: 1.5 10*3/uL (ref 0.7–4.0)
MCH: 30.7 pg (ref 26.0–34.0)
MCHC: 33.2 g/dL (ref 30.0–36.0)
MCV: 92.7 fL (ref 80.0–100.0)
Monocytes Absolute: 0.5 10*3/uL (ref 0.1–1.0)
Monocytes Relative: 9 %
Neutro Abs: 3 10*3/uL (ref 1.7–7.7)
Neutrophils Relative %: 56 %
Platelets: 220 10*3/uL (ref 150–400)
RBC: 4.36 MIL/uL (ref 4.22–5.81)
RDW: 12.3 % (ref 11.5–15.5)
WBC: 5.3 10*3/uL (ref 4.0–10.5)
nRBC: 0 % (ref 0.0–0.2)

## 2021-05-18 LAB — SEDIMENTATION RATE: Sed Rate: 11 mm/hr (ref 0–16)

## 2021-05-18 LAB — HIV ANTIBODY (ROUTINE TESTING W REFLEX): HIV Screen 4th Generation wRfx: NONREACTIVE

## 2021-05-18 MED ORDER — DOXYCYCLINE HYCLATE 100 MG PO TABS
100.0000 mg | ORAL_TABLET | Freq: Two times a day (BID) | ORAL | 0 refills | Status: DC
  Filled 2021-05-18: qty 10, 5d supply, fill #0

## 2021-05-18 NOTE — Care Management Obs Status (Signed)
MEDICARE OBSERVATION STATUS NOTIFICATION ? ? ?Patient Details  ?Name: Ruben Donovan ?MRN: [id number] ?Date of Birth: 1950-08-28 ? ? ?Medicare Observation Status Notification Given:  Yes ? ? ? ?Harriet Masson, RN ?05/18/2021, 10:00 AM ?

## 2021-05-18 NOTE — Discharge Instructions (Signed)
Follow with Primary MD in 7 days  ° °Get CBC, CMP,  checked  by Primary MD next visit.  ° ° °Activity: As tolerated with Full fall precautions use walker/cane & assistance as needed ° ° °Disposition Home  ° ° °Diet: Heart Healthy  ° ° °On your next visit with your primary care physician please Get Medicines reviewed and adjusted. ° ° °Please request your Prim.MD to go over all Hospital Tests and Procedure/Radiological results at the follow up, please get all Hospital records sent to your Prim MD by signing hospital release before you go home. ° ° °If you experience worsening of your admission symptoms, develop shortness of breath, life threatening emergency, suicidal or homicidal thoughts you must seek medical attention immediately by calling 911 or calling your MD immediately  if symptoms less severe. ° °You Must read complete instructions/literature along with all the possible adverse reactions/side effects for all the Medicines you take and that have been prescribed to you. Take any new Medicines after you have completely understood and accpet all the possible adverse reactions/side effects.  ° °Do not drive, operating heavy machinery, perform activities at heights, swimming or participation in water activities or provide baby sitting services if your were admitted for syncope or siezures until you have seen by Primary MD or a Neurologist and advised to do so again. ° °Do not drive when taking Pain medications.  ° ° °Do not take more than prescribed Pain, Sleep and Anxiety Medications ° °Special Instructions: If you have smoked or chewed Tobacco  in the last 2 yrs please stop smoking, stop any regular Alcohol  and or any Recreational drug use. ° °Wear Seat belts while driving. ° ° °Please note ° °You were cared for by a hospitalist during your hospital stay. If you have any questions about your discharge medications or the care you received while you were in the hospital after you are discharged, you can call the  unit and asked to speak with the hospitalist on call if the hospitalist that took care of you is not available. Once you are discharged, your primary care physician will handle any further medical issues. Please note that NO REFILLS for any discharge medications will be authorized once you are discharged, as it is imperative that you return to your primary care physician (or establish a relationship with a primary care physician if you do not have one) for your aftercare needs so that they can reassess your need for medications and monitor your lab values. ° °

## 2021-05-18 NOTE — Progress Notes (Signed)
Ruben Donovan to be D/C'd home per MD order. Discussed with the patient and all questions fully answered.  ?Skin clean, dry and intact without evidence of skin break down, no evidence of skin tears noted.  ?IV catheter discontinued intact. Site without signs and symptoms of complications. Dressing and pressure applied.  ?An After Visit Summary was printed and given to the patient.  ?Patient escorted via WC, and D/C home via private auto.  ?Jon Gills  ?05/18/2021 ?  ?   ?

## 2021-05-18 NOTE — Progress Notes (Signed)
SATURATION QUALIFICATIONS: (This note is used to comply with regulatory documentation for home oxygen)  Patient Saturations on Room Air at Rest = 100%  Patient Saturations on Room Air while Ambulating = 100%  

## 2021-05-18 NOTE — Discharge Summary (Addendum)
Physician Discharge Summary  ?Ruben Donovan UYQ:[id number] DOB: [birth year] DOA: 05/16/2021 ? ?PCP: Pcp, No ? ?Admit date: 05/16/2021 ?Discharge date: 05/18/2021 ? ?Admitted From: Home ?Disposition:  Home  ? ?Recommendations for Outpatient Follow-up:  ?Follow up with PCP in 1-2 weeks ?Should to follow-up with pulmonary as an outpatient in few weeks, to do pulmonary function test as an outpatient, and possible need for repeat imaging if PFT is abnormal. ?Please follow up on the following pending results: Hypersensitivity of pneumonia work-up. ? ?Home Health:NO ? ? ?Discharge Condition:Stable ?CODE STATUS:FULL ?Diet recommendation: Heart Healthy  ? ?Brief/Interim Summary: ? 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  He has noticed increasing shortness of breath with even simple activities.  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  He got so short of breath that he had his friend call his son who came up to [place] and brought him down here to [facility] for evaluation. ?  ?While walking from the parking lot into the triage area, he dropped his ambulatory saturations down to 80%.  He was placed on 2 L of oxygen. ?  ?He denies any chest pain or chest discomfort with walking.  He had a CABG 2 years ago.  He has never been a smoker.  He used to work for the [employer] in [employer].  He denies breathing any noxious fumes.  He is never worked with asbestos.  He does relate that he had COVID in August 2022. ? ?Dyspnea on exertion ?Viral pneumonia ?-Patient reports acute on chronic dyspnea, reports progressive dyspnea over the last 69-month, with acute worsening over the last couple days, acute worsening dyspnea most likely related to viral pneumonia/bronchitis from adenovirus  pneumonia.Marland Kitchen ?-He has been having progressive dyspnea, further work-up is indicated, but given acute infection, this work-up has been deferred for an outpatient setting as discussed with PCCM, recommendation is for PFT study as an outpatient after resolution of acute illness, ideally would be in 8 weeks, and if abnormal then repeat imaging may be warranted.  Ambulatory referral for pulmonary has been sent to [facility] clinic(patient lives in [place]). ?-Follow on results of hypersensitivity of pneumonia, labs were sent ?-2D echo was performed with a preserved EF, and no evidence of right heart strain. ?-Started on 5 days of oral doxycycline. ?-Ambulated in the hallway with 100% on room air all the time. ? ?  ?AKI (acute kidney injury) (HCC) ?Resolved with hydration ?  ?Hypoxia ?Resolved. ?  ?Hx of CABG ?Stable.  Continue aspirin and beta-blocker.  Continue statin. ?  ?Mixed hyperlipidemia ?Continue Lipitor. ?  ?Hypertension ?Continue blood pressure medicines ?  ?Chronic diastolic heart failure (HCC) ? BNP is slightly up from his labs last month (but still within normal limits) but certainly does not have any pulmonary edema.  He has no JVD.  He is euvolemic.  He has no peripheral edema. ?-Euvolemic ?  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 ? ? ?  ? ?Discharge Diagnoses:  ?Principal Problem: ?  Dyspnea on exertion ?Active Problems: ?  Hypoxia ?  AKI (acute kidney injury) (HCC) ?  Cardiac pacemaker in situ ?  Chronic diastolic heart failure (HCC) ?  Hypertension ?  Mixed hyperlipidemia ?  Hx of CABG ?  Viral pneumonia ? ? ? ?Discharge Instructions ? ?Discharge Instructions   ? ?  Ambulatory referral to Pulmonology   Complete by: As directed ?  ? Please evaluate for possible interstitial lung disease, and patient will need pulmonary function test in 1-month for further evaluation for progressive dyspnea.  ? Reason for referral: Interstitial Lung Disease(ILD)  ? Diet - low sodium heart healthy   Complete by: As  directed ?  ? Discharge instructions   Complete by: As directed ?  ? Follow with Primary MD  in 7 days  ? ?Get CBC, CMP,  checked  by Primary MD next visit.  ? ? ?Activity: As tolerated with Full fall precautions use walker/cane & assistance as needed ? ? ?Disposition Home  ? ? ?Diet: Heart Healthy  ? ? ?On your next visit with your primary care physician please Get Medicines reviewed and adjusted. ? ? ?Please request your Prim.MD to go over all Hospital Tests and Procedure/Radiological results at the follow up, please get all Hospital records sent to your Prim MD by signing hospital release before you go home. ? ? ?If you experience worsening of your admission symptoms, develop shortness of breath, life threatening emergency, suicidal or homicidal thoughts you must seek medical attention immediately by calling 911 or calling your MD immediately  if symptoms less severe. ? ?You Must read complete instructions/literature along with all the possible adverse reactions/side effects for all the Medicines you take and that have been prescribed to you. Take any new Medicines after you have completely understood and accpet all the possible adverse reactions/side effects.  ? ?Do not drive, operating heavy machinery, perform activities at heights, swimming or participation in water activities or provide baby sitting services if your were admitted for syncope or siezures until you have seen by Primary MD or a Neurologist and advised to do so again. ? ?Do not drive when taking Pain medications.  ? ? ?Do not take more than prescribed Pain, Sleep and Anxiety Medications ? ?Special Instructions: If you have smoked or chewed Tobacco  in the last 2 yrs please stop smoking, stop any regular Alcohol  and or any Recreational drug use. ? ?Wear Seat belts while driving. ? ? ?Please note ? ?You were cared for by a hospitalist during your hospital stay. If you have any questions about your discharge medications or the care you received  while you were in the hospital after you are discharged, you can call the unit and asked to speak with the hospitalist on call if the hospitalist that took care of you is not available. Once you are discharged, your primary care physician will handle any further medical issues. Please note that NO REFILLS for any discharge medications will be authorized once you are discharged, as it is imperative that you return to your primary care physician (or establish a relationship with a primary care physician if you do not have one) for your aftercare needs so that they can reassess your need for medications and monitor your lab values.  ? Increase activity slowly   Complete by: As directed ?  ? ?  ? ?Allergies as of 05/18/2021   ? ?   Reactions  ? Clonidine Derivatives   ? Hydralazine   ? ?  ? ?  ?Medication List  ?  ? ?TAKE these medications   ? ?albuterol 108 (90 Base) MCG/ACT inhaler ?Commonly known as: VENTOLIN HFA ?Inhale 2 puffs into the lungs every 4 (four) hours as needed for wheezing or shortness of breath. ?  ?amLODipine 5 MG tablet ?Commonly known as: NORVASC ?Take  5 mg by mouth daily. ?  ?aspirin 81 MG EC tablet ?Take 81 mg by mouth daily. ?  ?atorvastatin 40 MG tablet ?Commonly known as: LIPITOR ?Take 40 mg by mouth daily. ?  ?azelastine 0.1 % nasal spray ?Commonly known as: ASTELIN ?Place 2 sprays into both nostrils 2 (two) times daily as needed for rhinitis or allergies. ?  ?carvedilol 12.5 MG tablet ?Commonly known as: COREG ?Take 12.5 mg by mouth 2 (two) times daily. ?  ?CERAVE EX ?Apply 1 application. topically as needed (dry skin). ?  ?doxycycline 100 MG tablet ?Commonly known as: VIBRA-TABS ?Take 1 tablet (100 mg total) by mouth every 12 (twelve) hours. ?  ?folic acid 1 MG tablet ?Commonly known as: FOLVITE ?Take 1 mg by mouth daily. ?  ?hydrOXYzine 25 MG tablet ?Commonly known as: ATARAX ?Take 25 mg by mouth at bedtime. ?  ?lisinopril 30 MG tablet ?Commonly known as: ZESTRIL ?Take 30 mg by mouth daily. ?   ?triamcinolone cream 0.1 % ?Commonly known as: KENALOG ?Apply 1 application. topically 2 (two) times daily as needed (rash). ?  ? ?  ? ? ?Allergies  ?Allergen Reactions  ? Clonidine Derivatives   ? Hydralazine   ? ? ?

## 2021-05-25 LAB — HYPERSENSITIVITY PNEUMONITIS
A. Pullulans Abs: NEGATIVE
A.Fumigatus #1 Abs: NEGATIVE
Micropolyspora faeni, IgG: NEGATIVE
Pigeon Serum Abs: NEGATIVE
Thermoact. Saccharii: NEGATIVE
Thermoactinomyces vulgaris, IgG: NEGATIVE

## 2021-05-27 ENCOUNTER — Ambulatory Visit (INDEPENDENT_AMBULATORY_CARE_PROVIDER_SITE_OTHER): Payer: Medicare Other | Admitting: Internal Medicine

## 2021-05-27 ENCOUNTER — Encounter: Payer: Self-pay | Admitting: Internal Medicine

## 2021-05-27 DIAGNOSIS — R0609 Other forms of dyspnea: Secondary | ICD-10-CM | POA: Diagnosis not present

## 2021-05-27 DIAGNOSIS — I10 Essential (primary) hypertension: Secondary | ICD-10-CM

## 2021-05-27 DIAGNOSIS — R918 Other nonspecific abnormal finding of lung field: Secondary | ICD-10-CM

## 2021-05-27 MED ORDER — IRBESARTAN 150 MG PO TABS: 150.0000 mg | ORAL_TABLET | Freq: Every day | ORAL | 11 refills | Status: DC

## 2021-05-27 MED ORDER — AMOXICILLIN-POT CLAVULANATE 875-125 MG PO TABS: 1.0000 | ORAL_TABLET | Freq: Two times a day (BID) | ORAL | 1 refills | Status: DC

## 2021-05-27 NOTE — Assessment & Plan Note (Signed)
Try off acei 05/27/2021  ? ?In the best review of chronic cough to date ( NEJM 2016 375 [phone]) ,  ACEi are now felt to cause cough in up to  20% of pts which is a 4 fold increase from previous reports and does not include the variety of non-specific complaints we see in pulmonary clinic in pts on ACEi but previously attributed to another dx like  Copd/asthma and  include PNDS, throat congestion/ globus and chest congestion, "bronchitis", unexplained dyspnea and noct "strangling" sensations, and hoarseness, but also  atypical /refractory GERD symptoms like dysphagia and "bad heartburn"  ? ?The only way I know  to prove this is not an "ACEi Case" is a trial off ACEi x a minimum of 6 weeks then regroup.  ? ? ?>>> try avapro 150 mg and titrate to desired bp see avs for instructions unique to this ov ?  ?

## 2021-05-27 NOTE — Assessment & Plan Note (Signed)
Chest CTa 05/17/21 ?No evidence of pulmonary embolus. ? 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 ? ?Although there are clearly abnormalities on CT scan, they should probably be considered "microscopic" since not scene  on plain cxr .   ? ? In the setting of obvious "macroscopic" health issues (chronic cough and doe which I doubt are related),  I am very reluctatnt to embark on an invasive w/u at this point but will arrange consevative  follow up and in the meantime see what we can do to address the patient's subjective concerns.   ? ? ?Discussed in detail all the  indications, usual  risks and alternatives  relative to the benefits with patient who agrees to proceed with conservative f/u as outlined   ? ?Each maintenance medication was reviewed in detail including emphasizing most importantly the difference between maintenance and prns and under what circumstances the prns are to be triggered using an action plan format where appropriate. ? ?Total time for H and P, chart review, counseling,  directly observing portions of ambulatory 02 saturation study/ and generating customized AVS unique to this office visit / same day charting > 60 min with new pt. ?     ?  ?       ?

## 2021-05-27 NOTE — Patient Instructions (Addendum)
Stop lisinopril and start avapro (ibesartan) 150 mg one daily  - ok to break in half if too strong and take one twice daily if not strong enough ? ?Augmentin 875 mg take one pill twice daily  X 10 days - take at breakfast and supper with large glass of water.  It would help reduce the usual side effects (diarrhea and yeast infections) if you ate cultured yogurt at lunch.  ? ?If not completely better refill augmentin for 10 days  - if no better at all give me a call  ? ?To get the most out of exercise, you need to be continuously aware that you are short of breath, but never out of breath, for at least 30 minutes daily. As you improve, it will actually be easier for you to do the same amount of exercise  in  30 minutes so always push to the level where you are short of breath.    ? ?Make sure you check your oxygen saturations at highest level of activity  ? ?Please schedule a follow up office visit in 6 weeks, call sooner if needed  ? ? ? ? ? ? ?

## 2021-05-27 NOTE — Progress Notes (Signed)
? ?Ruben Donovan, male    DOB: March 12, 1950, 71 y.o.   MRN: [id number] ? ? 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  ? ? ? ? ?History of Present Illness  ?05/27/2021  Pulmonary/ 1st office eval/ Sherene Sires / [place] [facility] on ACEi  ?Chief Complaint  ?Patient presents with  ? Consult  ?  Ref for SOB and potential ILD  ?Referring provider recs PFT  ?[facility]  ?Dyspnea:  walks about a half mile some hills not steep do not have to stop and also do a shorter course up steeper hill near the house  ?Cough: varies, not as bad, sense of pnds and loss of voice still bothersome as well as nasal congetsion  ?Sleep: able to lie flat/ one pillow ?SABA use: not x 10 days  ? ?No obvious day to day or daytime variability or assoc excess/ purulent sputum or mucus plugs or hemoptysis or cp or chest tightness, subjective wheeze or overt  hb symptoms.  ? ?Sleeping now as above  without nocturnal  or early am exacerbation  of respiratory  c/o's or need for noct saba. Also denies any obvious fluctuation of symptoms with weather or environmental changes or other aggravating or alleviating factors except as outlined above  ? ?No unusual exposure hx or h/o childhood pna/ asthma or knowledge of premature birth. ? ?Current Allergies, Complete Past Medical History, Past Surgical History, Family History, and Social History were reviewed in [employer] record. ? ?ROS  The following are not active complaints unless bolded ?Hoarseness, sore throat,/globus dysphagia, dental problems, itching, sneezing,  nasal congestion or discharge of excess mucus or purulent secretions, ear ache,   fever, chills, sweats, unintended wt loss or wt gain, classically pleuritic or exertional cp,  orthopnea pnd or arm/hand swelling  or leg  swelling, presyncope, palpitations, abdominal pain, anorexia, nausea, vomiting, diarrhea  or change in bowel habits or change in bladder habits, change in stools or change in urine, dysuria, hematuria,  rash, arthralgias, visual complaints, headache, numbness, weakness or ataxia or problems with walking or coordination,  change in mood or  memory. ?      ?   ? ?Past Medical History:  ?Diagnosis Date  ? 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 CAD (coronary artery disease), native coronary artery   ? Hx of CABG   ? Hypertension   ? Pacemaker   ? Septic arthritis of left ankle (HCC) 08/29/2018  ? ? ?Outpatient Medications Prior to Visit  ?Medication Sig Dispense Refill  ? albuterol (VENTOLIN HFA) 108 (90 Base) MCG/ACT inhaler Inhale 2 puffs into the lungs every 4 (four) hours as needed for wheezing or shortness of breath.    ? amLODipine (NORVASC) 5 MG tablet Take 5 mg by mouth daily.    ? aspirin 81 MG EC tablet Take 81 mg by mouth daily.    ? atorvastatin (LIPITOR) 40 MG tablet Take 40 mg by mouth daily.    ? azelastine (ASTELIN) 0.1 % nasal spray Place 2 sprays into both nostrils 2 (two) times daily as needed for rhinitis or allergies.    ? carvedilol (COREG) 12.5 MG tablet Take 12.5 mg by mouth 2 (two) times daily.    ? Emollient (CERAVE EX) Apply 1 application.  topically as needed (dry skin).    ? folic acid (FOLVITE) 1 MG tablet Take 1 mg by mouth daily.    ? hydrOXYzine (ATARAX) 25 MG tablet Take 25 mg by mouth at bedtime.    ? lisinopril (ZESTRIL) 30 MG tablet Take 30 mg by mouth daily.    ? triamcinolone cream (KENALOG) 0.1 % Apply 1 application. topically 2 (two) times daily as needed (rash).    ? doxycycline (VIBRA-TABS) 100 MG tablet Take 1 tablet (100 mg total) by mouth every 12 (twelve) hours. 10 tablet 0  ? ?No facility-administered medications prior to visit.  ? ? ? ?Objective:  ?  ? ?BP 132/68 (BP Location: Left Arm, Patient Position: Sitting)   Pulse 62   Temp 98.7 ?F (37.1 ?C) (Temporal)   Ht 5'  5" (1.651 m)   Wt 137 lb (62.1 kg)   SpO2 98% Comment: ra  BMI 22.80 kg/m?  ? ?SpO2: 98 % (ra) amb wm with nasal tone to voice/ nad/ mild pseudowheeze only  ? ? HEENT : nl exam   ? ? ?NECK :  without JVD/Nodes/TM/ nl carotid upstrokes bilaterally ? ? ?LUNGS: no acc muscle use,  Nl contour chest which is clear to A and P bilaterally without cough on insp or exp maneuvers ? ? ?CV:  RRR  no s3 or murmur or increase in P2, and no edema  ? ?ABD:  soft and nontender with nl inspiratory excursion in the supine position. No bruits or organomegaly appreciated, bowel sounds nl ? ?MS:  Nl gait/ ext warm without deformities, calf tenderness, cyanosis or clubbing ?No obvious joint restrictions  ? ?SKIN: warm and dry without lesions   ? ?NEURO:  alert, approp, nl sensorium with  no motor or cerebellar deficits apparent.  ? ? ? ?I personally reviewed images and agree with radiology impression as follows:  ? Chest CTa 05/17/21 ?No evidence of pulmonary embolus. ? Clustered nodular densities and ground-glass densities in the left ?lower lobe and right middle lobe compatible with small airways ?disease. ?My review:  plain cxr pa and lat nl 05/16/21  ? ?   ?Assessment  ? ?Dyspnea on exertion ?Onset with covid aug 2022  ?- Echo 05/17/21  G1 diastolic dysfunction mild MR, nl LA ?- Chest CTa 05/17/21 ?No evidence of pulmonary embolus. 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 ?- 05/27/2021   Walked on RA  x  3  lap(s) =  approx 450  ft  @ mod pace, stopped due to end of study with lowest 02 sats 96% and mild sob on 2nd lap  ?- assoc with nasal congestion/ hoarseness on pulmonary eval 05/27/2021 so rec augmentin/ try off acei and f/u in 6 weeks  ? ?Symptoms are disproportionate to objective findings and not clear to what extent this is actually a pulmonary  problem but pt does appear to have difficult to sort out respiratory symptoms of unknown  origin for which  DDX  = almost all start with A and  include Adherence, Ace Inhibitors, Acid Reflux, Active Sinus Disease, Alpha 1 Antitripsin deficiency, Anxiety masquerading as Airways dz,  ABPA,  Allergy(esp in young), Aspiration (esp in elderly), Adverse effects of meds,  Active smoking or Vaping, A bunch of PE's/clot burden (a few small clots can't cause this syndrome unless there is already severe underlying pulm or vascular dz with poor reserve),  Anemia or thyroid disorder, plus two  Bs  = Bronchiectasis and Beta blocker use..and one C= CHF  ? ?Adherence is always the initial "prime suspect" and is a multilayered concern that requires a "trust but verify" approach in every patient - starting with knowing how to use medications, especially inhalers, correctly, keeping up with refills and understanding the fundamental difference between maintenance and prns vs those medications only taken for a very short course and then stopped and not refilled.  ? ?ACEi adverse effects at the  top of the usual list of suspects and the only way to rule it out is a trial off > see a/p   ? ?? Active sinus dz > augmentin x 10 -20 d and f/u with ct  Sinus or ent if not clear  ? ?? ? Anxiety/depression/ deconditioning  > usually at the bottom of this list of usual suspects but  may interfere with adherence and also interpretation of response or lack thereof to symptom management which can be quite subjective.  ?>>> specific sub max reconditioning rec  ? ??  A bunch of PE's > CTa neg 05/17/21 ? ?Anemia/ thyroid dz ?  ?Lab Results  ?Component Value Date  ? HGB 13.4 05/18/2021  ? HGB 14.9 05/16/2021  ?  ?>>> no tsh on file ? ?? BBocker effects > low dose coreg probably ok here. ? ?? chf >  Not echo mild mr but no LAE so probably not the issue here ? ?>>> f/u in 6 weeks  ? ?Essential hypertension ?Try off acei 05/27/2021  ? ?In the best review of chronic cough to date ( NEJM 2016 375 [phone]) ,  ACEi are now felt to cause cough in up  to  20% of pts which is a 4 fold increase from previous reports and does not include the variety of non-specific complaints we see in pulmonary clinic in pts on ACEi but previously attributed to another dx like  Co

## 2021-05-27 NOTE — Assessment & Plan Note (Addendum)
Onset with covid aug 2022  ?- Echo 05/17/21  G1 diastolic dysfunction mild MR, nl LA ?- Chest CTa 05/17/21 ?No evidence of pulmonary embolus. 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 ?- 05/27/2021   Walked on RA  x  3  lap(s) =  approx 450  ft  @ mod pace, stopped due to end of study with lowest 02 sats 96% and mild sob on 2nd lap  ?- assoc with nasal congestion/ hoarseness on pulmonary eval 05/27/2021 so rec augmentin/ try off acei and f/u in 6 weeks  ? ?Symptoms are disproportionate to objective findings and not clear to what extent this is actually a pulmonary  problem but pt does appear to have difficult to sort out respiratory symptoms of unknown origin for which  DDX  = almost all start with A and  include Adherence, Ace Inhibitors, Acid Reflux, Active Sinus Disease, Alpha 1 Antitripsin deficiency, Anxiety masquerading as Airways dz,  ABPA,  Allergy(esp in young), Aspiration (esp in elderly), Adverse effects of meds,  Active smoking or Vaping, A bunch of PE's/clot burden (a few small clots can't cause this syndrome unless there is already severe underlying pulm or vascular dz with poor reserve),  Anemia or thyroid disorder, plus two Bs  = Bronchiectasis and Beta blocker use..and one C= CHF  ? ?Adherence is always the initial "prime suspect" and is a multilayered concern that requires a "trust but verify" approach in every patient - starting with knowing how to use medications, especially inhalers, correctly, keeping up with refills and understanding the fundamental difference between maintenance and prns vs those medications only taken for a very short course and then stopped and not refilled.  ? ?ACEi adverse effects at the  top of the usual list of suspects and the only way to rule it out is a trial off > see a/p   ? ?? Active sinus dz > augmentin x 10 -20 d and f/u with ct  Sinus or ent if not clear  ? ?? ?  Anxiety/depression/ deconditioning  > usually at the bottom of this list of usual suspects but  may interfere with adherence and also interpretation of response or lack thereof to symptom management which can be quite subjective.  ?>>> specific sub max reconditioning rec  ? ??  A bunch of PE's > CTa neg 05/17/21 ? ?Anemia/ thyroid dz ?  ?Lab Results  ?Component Value Date  ? HGB 13.4 05/18/2021  ? HGB 14.9 05/16/2021  ?  ?>>> no tsh on file ? ?? BBocker effects > low dose coreg probably ok here. ? ?? chf >  Not echo mild mr but no LAE so probably not the issue here ? ?>>> f/u in 6 weeks  ?

## 2021-06-28 ENCOUNTER — Institutional Professional Consult (permissible substitution): Payer: Medicare Other | Admitting: Internal Medicine

## 2021-07-12 ENCOUNTER — Encounter: Payer: Self-pay | Admitting: Internal Medicine

## 2021-07-12 ENCOUNTER — Ambulatory Visit (INDEPENDENT_AMBULATORY_CARE_PROVIDER_SITE_OTHER): Payer: Medicare Other | Admitting: Internal Medicine

## 2021-07-12 DIAGNOSIS — R0609 Other forms of dyspnea: Secondary | ICD-10-CM | POA: Diagnosis not present

## 2021-07-12 DIAGNOSIS — R058 Other specified cough: Secondary | ICD-10-CM

## 2021-07-12 DIAGNOSIS — I10 Essential (primary) hypertension: Secondary | ICD-10-CM | POA: Diagnosis not present

## 2021-07-12 NOTE — Assessment & Plan Note (Addendum)
Onset with covid aug 2022  - Echo [id number]  G1 diastolic dysfunction mild MR, nl LA - Chest CTa 05/17/21 No evidence of pulmonary embolus. Clustered nodular densities and ground-glass densities in the left lower lobe and right middle lobe compatible with small airways Disease. - 05/27/2021   Walked on RA  x  3  lap(s) =  approx 450  ft  @ mod pace, stopped due to end of study with lowest 02 sats 96% and mild sob on 2nd lap  - assoc with nasal congestion/ hoarseness on pulmonary eval 05/27/2021 so rec augmentin/ try off acei and f/u in 6 weeks > improved 07/12/2021   - 07/12/2021   Walked on RA  x  3  lap(s) =  approx 450  ft  @ mod fast  pace, stopped due to end of study with lowest 02 sats 97% min sob last lap    Improved back to baseline, no pulmonary f/u needed

## 2021-07-12 NOTE — Assessment & Plan Note (Addendum)
Try off acei 05/27/2021 > improved 07/12/2021   Although even in retrospect it may not be clear the ACEi contributed to the pt's symptoms,  Pt improved off them and adding them back at this point or in the future would risk confusion in interpretation of non-specific respiratory symptoms to which this patient is prone  ie  Better not to muddy the waters here.   >>> avapro 150 mg daily, f/u PCP   Each maintenance medication was reviewed in detail including emphasizing most importantly the difference between maintenance and prns and under what circumstances the prns are to be triggered using an action plan format where appropriate.  Total time for H and P, chart review, counseling,  directly observing portions of ambulatory 02 saturation study/ and generating customized AVS unique to this office visit / same day charting > 30 min

## 2021-07-12 NOTE — Assessment & Plan Note (Signed)
Onset with covid aug 2022  - Augmentin x 20 days completed 06/06/21 no better nasal congestion  - Sinus CT  07/12/2021 >>>  - Allergy screen 07/12/2021 >  Eos 0. /  IgE  Pending    Impressive refractory nasal "congestion" with nl exam so needs ent eval if sinus ct abn and/or allergy eval depending on results of above but pulmonary f/u can be prn

## 2021-07-12 NOTE — Progress Notes (Signed)
Ruben Donovan, male    DOB: [birth year], 71 y.o.   MRN: [id number]   Brief patient profile:  70  yowm  never smoker/ high school fb/bb ref s resp problems  referred to pulmonary clinic in [place]  05/27/2021 by Dr Randol Kern for post covid aug 2022 cough and doe  - was vax x 4 and not hospitatlized.      History of Present Illness  05/27/2021  Pulmonary/ 1st office eval/ Markesha Hannig / [place] [facility] on ACEi  Chief Complaint  Patient presents with   Consult    Ref for SOB and potential ILD  Referring provider recs PFT  [facility] admission from 4/3-4/5  Dyspnea:  walks about a half mile some hills not steep do not have to stop and also do a shorter course up steeper hill near the house  Cough: varies, not as bad, sense of pnds and loss of voice still bothersome as well as nasal congetsion  Sleep: able to lie flat/ one pillow SABA use: not x 10 days  Rec Stop lisinopril and start avapro (ibesartan) 150 mg one daily  - ok to break in half if too strong and take one twice daily if not strong enough Augmentin 875 mg take one pill twice daily  X 10 days - take at breakfast and supper with large glass of water. If not completely better refill augmentin for 10 days  - if no better at all give me a call  To get the most out of exercise, you need to be continuously aware that you are short of breath, but never out of breath, for at least 30 minutes daily. As you improve, it will actually be easier for you to do the same amount of exercise  in  30 minutes so always push to the level where you are short of breath.    Make sure you check your oxygen saturations at highest level of activity      07/12/2021  f/u ov/ office/Sharan Mcenaney re: post covid cough/ maint on ARB and improved   Chief Complaint  Patient presents with   Follow-up    Feels he has improved since last ov. Some wheezing occasionally   Dyspnea:  improving but limited by L ankle/ drag a cart several times a week for exercise  Cough:  variable but overall better  Sleeping: ok lying flat/ one pillow  SABA use: none  02: none needed  Covid status: vax x 4      No obvious day to day or daytime variability or assoc excess/ purulent sputum or mucus plugs or hemoptysis or cp or chest tightness, subjective wheeze or overt sinus or hb symptoms.   Sleeping  without nocturnal  or early am exacerbation  of respiratory  c/o's or need for noct saba. Also denies any obvious fluctuation of symptoms with weather or environmental changes or other aggravating or alleviating factors except as outlined above   No unusual exposure hx or h/o childhood pna/ asthma or knowledge of premature birth.  Current Allergies, Complete Past Medical History, Past Surgical History, Family History, and Social History were reviewed in [employer] record.  ROS  The following are not active complaints unless bolded Hoarseness, sore throat, dysphagia, dental problems, itching, sneezing,  nasal congestion or discharge of excess mucus or purulent secretions, ear ache,   fever, chills, sweats, unintended wt loss or wt gain, classically pleuritic or exertional cp,  orthopnea pnd or arm/hand swelling  or leg swelling, presyncope, palpitations, abdominal  pain, anorexia, nausea, vomiting, diarrhea  or change in bowel habits or change in bladder habits, change in stools or change in urine, dysuria, hematuria,  rash, arthralgias, visual complaints, headache, numbness, weakness or ataxia or problems with walking or coordination,  change in mood or  memory.        Current Meds  Medication Sig   albuterol (VENTOLIN HFA) 108 (90 Base) MCG/ACT inhaler Inhale 2 puffs into the lungs every 4 (four) hours as needed for wheezing or shortness of breath.   amLODipine (NORVASC) 5 MG tablet Take 5 mg by mouth daily.   aspirin 81 MG EC tablet Take 81 mg by mouth daily.   atorvastatin (LIPITOR) 40 MG tablet Take 40 mg by mouth daily.   azelastine (ASTELIN) 0.1 %  nasal spray Place 2 sprays into both nostrils 2 (two) times daily as needed for rhinitis or allergies.   carvedilol (COREG) 12.5 MG tablet Take 12.5 mg by mouth 2 (two) times daily.   Emollient (CERAVE EX) Apply 1 application. topically as needed (dry skin).   folic acid (FOLVITE) 1 MG tablet Take 1 mg by mouth daily.   hydrOXYzine (ATARAX) 25 MG tablet Take 25 mg by mouth at bedtime.   irbesartan (AVAPRO) 150 MG tablet Take 1 tablet (150 mg total) by mouth daily.   triamcinolone cream (KENALOG) 0.1 % Apply 1 application. topically 2 (two) times daily as needed (rash).                   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/29/2018         Objective:     Wt Readings from Last 3 Encounters:  07/12/21 137 lb (62.1 kg)  05/27/21 137 lb (62.1 kg)  05/18/21 137 lb 12.6 oz (62.5 kg)      Vital signs reviewed  07/12/2021  - Note at rest 02 sats   98% on RA   General appearance:    amb wm nasal tone to voice   HEENT : Oropharynx  clear   Nasal turbintes nl    NECK :  without  appent JVD/ palpable Nodes/TM    LUNGS: no acc muscle use,  Nl contour chest which is clear to A and P bilaterally without cough on insp or exp maneuvers   CV:  RRR  no s3 or murmur or increase in P2, and no edema   ABD:  soft and nontender with nl inspiratory excursion in the supine position. No bruits or organomegaly appreciated   MS: walks with limp -ext warm without deformities Or obvious joint restrictions  calf tenderness, cyanosis or clubbing     SKIN: warm and dry without lesions    NEURO:  alert, approp, nl sensorium with  no motor or cerebellar deficits apparent.               Assessment

## 2021-07-12 NOTE — Patient Instructions (Signed)
Please remember to go to the lab department   for your tests - we will call you with the results when they are available.      My office will be contacting you by phone for sinus ct and I will call you with results   If you are satisfied with your treatment plan,  let your doctor know and he/she can either refill your medications or you can return here when your prescription runs out.     If in any way you are not 100% satisfied,  please tell us.  If 100% better, tell your friends!  Pulmonary follow up is as needed

## 2021-07-15 LAB — CBC WITH DIFFERENTIAL/PLATELET
Basophils Absolute: 0 10*3/uL (ref 0.0–0.2)
Basos: 1 %
EOS (ABSOLUTE): 0.3 10*3/uL (ref 0.0–0.4)
Eos: 6 %
Hematocrit: 46.5 % (ref 37.5–51.0)
Hemoglobin: 15.5 g/dL (ref 13.0–17.7)
Immature Grans (Abs): 0 10*3/uL (ref 0.0–0.1)
Immature Granulocytes: 0 %
Lymphocytes Absolute: 1.2 10*3/uL (ref 0.7–3.1)
Lymphs: 22 %
MCH: 31.2 pg (ref 26.6–33.0)
MCHC: 33.3 g/dL (ref 31.5–35.7)
MCV: 94 fL (ref 79–97)
Monocytes Absolute: 0.3 10*3/uL (ref 0.1–0.9)
Monocytes: 6 %
Neutrophils Absolute: 3.4 10*3/uL (ref 1.4–7.0)
Neutrophils: 65 %
Platelets: 212 10*3/uL (ref 150–450)
RBC: 4.97 x10E6/uL (ref 4.14–5.80)
RDW: 13 % (ref 11.6–15.4)
WBC: 5.2 10*3/uL (ref 3.4–10.8)

## 2021-07-15 LAB — IGE: IgE (Immunoglobulin E), Serum: 102 IU/mL (ref 6–495)

## 2021-08-04 ENCOUNTER — Ambulatory Visit (HOSPITAL_COMMUNITY)
Admission: RE | Admit: 2021-08-04 | Discharge: 2021-08-04 | Disposition: A | Discharge status: Home / Self Care | Payer: Medicare Other | Source: Ambulatory Visit | Attending: Internal Medicine | Admitting: Internal Medicine

## 2021-08-04 DIAGNOSIS — R058 Other specified cough: Secondary | ICD-10-CM

## 2021-08-05 ENCOUNTER — Other Ambulatory Visit: Payer: Self-pay

## 2021-08-05 DIAGNOSIS — J309 Allergic rhinitis, unspecified: Secondary | ICD-10-CM

## 2021-10-12 ENCOUNTER — Ambulatory Visit (INDEPENDENT_AMBULATORY_CARE_PROVIDER_SITE_OTHER): Payer: Medicare Other | Admitting: Allergy & Immunology

## 2021-10-12 ENCOUNTER — Other Ambulatory Visit: Payer: Self-pay

## 2021-10-12 ENCOUNTER — Encounter: Payer: Self-pay | Admitting: Allergy & Immunology

## 2021-10-12 VITALS — BP 130/70 | HR 84 | Temp 98.2°F | Resp 17 | Ht 63.5 in | Wt 140.4 lb

## 2021-10-12 DIAGNOSIS — B999 Unspecified infectious disease: Secondary | ICD-10-CM | POA: Diagnosis not present

## 2021-10-12 DIAGNOSIS — J310 Chronic rhinitis: Secondary | ICD-10-CM | POA: Diagnosis not present

## 2021-10-12 MED ORDER — AZELASTINE HCL 0.1 % NA SOLN
1.0000 | Freq: Two times a day (BID) | NASAL | 5 refills | Status: AC
Start: 2021-10-12 — End: ?

## 2021-10-12 NOTE — Patient Instructions (Addendum)
1. Non-allergic rhinitis - Testing was negative to the entire panel. - Copy of testing results provided. - This proves that this is not allergic mediated, but we can also treat with nasal sprays anyway. - Start Astelin (azelastine) one spray per nostril twice daily (this tastes terrible, but it can be very helpful).   2. Recurrent infections - We will obtain some screening labs to evaluate your immune system.  - Labs to evaluate the quantitative [facility]) aspects of your immune system: IgG/IgA/IgM, CBC with differential - Labs to evaluate the qualitative (HOW WELL THEY WORK) aspects of your immune system: CH50, Pneumococcal titers, Tetanus titers, Diphtheria titers - We may consider immunizations with Pneumovax and Tdap to challenge your immune system, and then obtain repeat titers in 4-6 weeks.   3. Return in about 3 months (around 01/12/2022).    Please inform us of any Emergency Department visits, hospitalizations, or changes in symptoms. Call us before going to the ED for breathing or allergy symptoms since we might be able to fit you in for a sick visit. Feel free to contact us anytime with any questions, problems, or concerns.  It was a pleasure to meet you today!  Websites that have reliable patient information: 1. American Academy of Asthma, Allergy, and Immunology: www.aaaai.org 2. Food Allergy Research and Education (FARE): foodallergy.org 3. Mothers of Asthmatics: http://www.asthmacommunitynetwork.org 4. American College of Allergy, Asthma, and Immunology: www.acaai.org   COVID-19 Vaccine Information can be found at: [email or web address] For questions related to vaccine distribution or appointments, please email [email or web address] or call [phone].   We realize that you might be concerned about having an allergic reaction to the COVID19 vaccines. To help with that concern, WE ARE OFFERING THE COVID19 VACCINES IN  OUR OFFICE! Ask the front desk for dates!     "Like" [place] on Facebook and Instagram for our latest updates!      A healthy democracy works best when [employer] participate! Make sure you are registered to vote! If you have moved or changed any of your contact information, you will need to get this updated before voting!  In some cases, you MAY be able to register to vote online: [email or web address]      Airborne Adult Perc - 10/12/21 0917     Time Antigen Placed 3244    Allergen Manufacturer Waynette Buttery    Location Back    Number of Test 59    1. Control-Buffer 50% Glycerol Negative    2. Control-Histamine 1 mg/ml 2+    3. Albumin saline Negative    4. Bahia Negative    5. French Southern Territ[place] Negative    6. Johnson Negative    7. Kentucky Blue Negative    8. Meadow Fescue Negative    9. Perennial Rye Negative    10. Sweet Vernal Negative    11. Timothy Negative    12. Cocklebur Negative    13. Burweed Marshelder Negative    14. Ragweed, short Negative    15. Ragweed, Giant Negative    16. Plantain,  English Negative    17. Lamb's Quarters Negative    18. Sheep Sorrell Negative    19. Rough Pigweed Negative    20. Marsh Elder, Rough Negative    21. Mugwort, Common Negative    22. Ash mix Negative    23. Birch mix Negative    24. Beech American Negative    25. Box, Elder Negative    26. Cedar, red Negative    27. Cottonwood, Guinea-Bissau Negative  28. Elm mix Negative    29. Hickory Negative    30. Maple mix Negative    31. Oak, Guinea-[place]au mix Negative    32. Pecan Pollen Negative    33. Pine mix Negative    34. Sycamore Eastern Negative    35. Walnut, Black Pollen Negative    36. Alternaria alternata Negative    37. Cladosporium Herbarum Negative    38. Aspergillus mix Negative    39. Penicillium mix Negative    40. Bipolaris sorokiniana (Helminthosporium) Negative    41. Drechslera spicifera (Curvularia) Negative    42. Mucor plumbeus Negative    43.  Fusarium moniliforme Negative    44. Aureobasidium pullulans (pullulara) Negative    45. Rhizopus oryzae Negative    46. Botrytis cinera Negative    47. Epicoccum nigrum Negative    48. Phoma betae Negative    49. Candida Albicans Negative    50. Trichophyton mentagrophytes Negative    51. Mite, D Farinae  5,000 AU/ml Negative    52. Mite, D Pteronyssinus  5,000 AU/ml Negative    53. Cat Hair 10,000 BAU/ml Negative    54.  Dog Epithelia Negative    55. Mixed Feathers Negative    56. Horse Epithelia Negative    57. Cockroach, German Negative    58. Mouse Negative    59. Tobacco Leaf Negative             Intradermal - 10/12/21 1029     Time Antigen Placed 1015    Allergen Manufacturer Waynette Buttery    Location Arm    Number of Test 15    Intradermal Select    Control Negative    French Southern Territories Negative    Johnson Negative    7 Grass Negative    Ragweed mix Negative    Weed mix Negative    Tree mix Negative    Mold 1 Negative    Mold 2 Negative    Mold 3 Negative    Mold 4 Negative    Cat Negative    Dog Negative    Cockroach Negative    Mite mix Negative            Rhinitis (Hayfever) Overview  There are two types of rhinitis: allergic and non-allergic.  Allergic Rhinitis If you have allergic rhinitis, your immune system mistakenly identifies a typically harmless substance as an intruder. This substance is called an allergen. The immune system responds to the allergen by releasing histamine and chemical mediators that typically cause symptoms in the nose, throat, eyes, ears, skin and roof of the mouth.  Seasonal allergic rhinitis (hay fever) is most often caused by pollen carried in the air during different times of the year in different parts of the country.  Allergic rhinitis can also be triggered by common indoor allergens such as the dried skin flakes, urine and saliva found on pet dander, mold, droppings from dust mites and cockroach particles. This is called perennial  allergic rhinitis, as symptoms typically occur year-round.  In addition to allergen triggers, symptoms may also occur from irritants such as smoke and strong odors, or to changes in the temperature and humidity of the air. This happens because allergic rhinitis causes inflammation in the nasal lining, which increases sensitivity to inhalants.  Many people with allergic rhinitis are prone to allergic conjunctivitis (eye allergy). In addition, allergic rhinitis can make symptoms of asthma worse for people who suffer from both conditions.  Nonallergic Rhinitis At least one out  of three people with rhinitis symptoms do not have allergies. Nonallergic rhinitis usually afflicts adults and causes year-round symptoms, especially runny nose and nasal congestion. This condition differs from allergic rhinitis because the immune system is not involved.

## 2021-10-12 NOTE — Progress Notes (Signed)
NEW PATIENT  Date of Service/Encounter:  10/12/21  Consult requested by: Ruben Pont, DO   Assessment:   Non-allergic rhinitis  Recurrent infections - with history of an invasive staphylococcal infection in 2016  COVID-19 infection (August 2022) - managed as outpatient  Plan/Recommendations:   1. Non-allergic rhinitis - Testing was negative to the entire panel. - Copy of testing results provided. - This proves that this is not allergic mediated, but we can also treat with nasal sprays anyway. - Start Astelin (azelastine) one spray per nostril twice daily (this tastes terrible, but it can be very helpful).   2. Recurrent infections - We will obtain some screening labs to evaluate your immune system.  - Labs to evaluate the quantitative [place]) aspects of your immune system: IgG/IgA/IgM, CBC with differential - Labs to evaluate the qualitative (HOW WELL THEY WORK) aspects of your immune system: CH50, Pneumococcal titers, Tetanus titers, Diphtheria titers - We may consider immunizations with Pneumovax and Tdap to challenge your immune system, and then obtain repeat titers in 4-6 weeks.   3. Return in about 3 months (around 01/12/2022).   This note in its entirety was forwarded to the Provider who requested this consultation.  Subjective:   Ruben Donovan is a 71 y.o. 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  Ruben Donovan was referred by Ruben Pont, DO.     Ruben Donovan is a 71 y.o. male presenting for an evaluation of environmental allergies .  He was referred by Ruben Donovan. He saw him once one month ago. At the time, Ruben Donovan was hospitalized for two days due to a cough and breathing problems. He was diagnosed with CAP and started on antibiotics. He was placed on doxycycline for five days. He is not back to 100%.   He does report some rhinorrhea and eye discharge. He did have COVID in August 2022. After that, he has had lingering effects from a pulmonary standpoint. He does report some issues in the spring time when the pollen is high. He does not have cats or dogs right now. They never bothered him at all. Around 4-5 months ago, he was taking antihistamines when his symptoms were particularly bad. He did not really have the same issues to the same extent prior to COVID.   He works on a [employer], corporate. He is working 3-4 days per week. He used to work for [employer].   He was never a smoker. He does have albuterol on his list of medications. But he used it for only a couple of weeks or so.   In 2016, he was at [facility] for an MSSA infection. 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     Skin Symptom History: He does have atopic dermatitis. He has a prescription ointment (  triamcinolone). He last refilled it 4-5 months ago or so. It is mostly on his back where he cannot even reach. He does have a dermatologist Dr. Michaell Donovan in [place]. This is where he lives in [place].  He has seen Dermatologist for 2-3 years or so. He had it bad on his legs, but this has calmed down.   He has an extensive cardiac history. This is all under good control.   Otherwise, there is no history of other atopic diseases, including asthma, food allergies, drug allergies,  stinging insect allergies, or eczema. There is no significant infectious history. Vaccinations are up to date.    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 If you have any questions, ask your nurse or doctor.          albuterol 108 (90 Base) MCG/ACT inhaler Commonly known as: VENTOLIN HFA Inhale 2 puffs into the lungs every 4 (four) hours as needed for wheezing or shortness of breath.   amLODipine 5 MG tablet Commonly known as: NORVASC Take 5 mg by mouth daily.   aspirin EC 81 MG tablet Take 81 mg by mouth daily.   atorvastatin 40 MG tablet Commonly known as: LIPITOR Take 40 mg by mouth daily.   azelastine 0.1 % nasal spray Commonly known as: ASTELIN Place 1 spray into both nostrils in the morning and at bedtime. What changed:  how much to take when to take this reasons to take this Changed by: Ruben Spruce, MD   carvedilol 12.5 MG tablet Commonly known as: COREG Take 12.5 mg by mouth 2 (two) times daily.   CERAVE EX Apply 1 application. topically as needed (dry skin).   folic acid 1 MG tablet Commonly known as: FOLVITE Take 1 mg by mouth daily.   hydrOXYzine 25 MG tablet Commonly known as: ATARAX Take 25 mg by mouth at bedtime.   irbesartan  150 MG tablet Commonly known as: Avapro Take 1 tablet (150 mg total) by mouth daily.   triamcinolone cream 0.1 % Commonly known as: KENALOG Apply 1 application. topically 2 (two) times daily as needed (rash).        Birth History: non-contributory  Developmental History: non-contributory  Past Surgical History: Past Surgical History:  Procedure Laterality Date   CORONARY ARTERY BYPASS GRAFT     FOOT SURGERY Left    HAND SURGERY Bilateral      Family History: History reviewed. No pertinent family history.   Social History: Adoni lives at home in a house that is 71 years old. There is carpeting throughout the home. There is a heat pump for heating and cooling. Ther   Review of Systems  Constitutional: Negative.  Negative for fever, malaise/fatigue  and weight loss.  HENT:  Positive for congestion. Negative for ear discharge and ear pain.   Eyes:  Negative for pain, discharge and redness.  Respiratory:  Positive for cough. Negative for sputum production, shortness of breath and wheezing.   Cardiovascular:  Negative for chest pain, palpitations and PND.  Gastrointestinal:  Negative for abdominal pain, heartburn, nausea and vomiting.  Skin: Negative.  Negative for itching and rash.  Neurological:  Negative for dizziness and headaches.  Endo/Heme/Allergies:  Negative for environmental allergies. Does not bruise/bleed easily.       Objective:   Blood pressure 130/70, pulse 84, temperature 98.2 F (36.8 C), temperature source Temporal, resp. rate 17, height 5' 3.5" (1.613 m), weight 140 lb 6.4 oz (63.7 kg), SpO2 97 %. Body mass index is 24.48 kg/m.     Physical Exam Vitals reviewed.  Constitutional:      Appearance: He is well-developed.  HENT:     Head: Normocephalic and atraumatic.     Right Ear: Tympanic membrane, ear canal and external ear normal. No drainage, swelling or tenderness. Tympanic membrane is not injected, scarred, erythematous, retracted or  bulging.     Left Ear: Tympanic membrane, ear canal and external ear normal. No drainage, swelling or tenderness. Tympanic membrane is not injected, scarred, erythematous, retracted or bulging.     Nose: No nasal deformity, septal deviation, mucosal edema or rhinorrhea.     Right Turbinates: Enlarged, swollen and pale.     Left Turbinates: Enlarged, swollen and pale.     Right Sinus: No maxillary sinus tenderness or frontal sinus tenderness.     Left Sinus: No maxillary sinus tenderness or frontal sinus tenderness.     Mouth/Throat:     Mouth: Mucous membranes are not pale and not dry.     Pharynx: Uvula midline.  Eyes:     General: Lids are normal. Allergic shiner present.        Right eye: No discharge.        Left eye: No discharge.     Conjunctiva/sclera: Conjunctivae normal.     Right eye: Right conjunctiva is not injected. No chemosis.    Left eye: Left conjunctiva is not injected. No chemosis.    Pupils: Pupils are equal, round, and reactive to light.  Cardiovascular:     Rate and Rhythm: Normal rate and regular rhythm.     Heart sounds: Normal heart sounds.  Pulmonary:     Effort: Pulmonary effort is normal. No tachypnea, accessory muscle usage or respiratory distress.     Breath sounds: Normal breath sounds. No wheezing, rhonchi or rales.  Chest:     Chest wall: No tenderness.  Abdominal:     Tenderness: There is no abdominal tenderness. There is no guarding or rebound.  Lymphadenopathy:     Head:     Right side of head: No submandibular, tonsillar or occipital adenopathy.     Left side of head: No submandibular, tonsillar or occipital adenopathy.     Cervical: No cervical adenopathy.  Skin:    General: Skin is warm.     Capillary Refill: Capillary refill takes less than 2 seconds.     Coloration: Skin is not pale.     Findings: No abrasion, erythema, petechiae or rash. Rash is not papular, urticarial or vesicular.  Neurological:     Mental Status: He is alert.   Psychiatric:        Behavior: Behavior is cooperative.     Diagnostic studies:  Allergy Studies:     Airborne Adult Perc - 10/12/21 0917     Time Antigen Placed 9562    Allergen Manufacturer Waynette Buttery    Location Back    Number of Test 59    1. Control-Buffer 50% Glycerol Negative    2. Control-Histamine 1 mg/ml 2+    3. Albumin saline Negative    4. Bahia Negative    5. French Southern Territories Negative    6. Johnson Negative    7. [place] Blue Negative    8. Meadow Fescue Negative    9. Perennial Rye Negative    10. Sweet Vernal Negative    11. Timothy Negative    12. Cocklebur Negative    13. Burweed Marshelder Negative    14. Ragweed, short Negative    15. Ragweed, Giant Negative    16. Plantain,  English Negative    17. Lamb's Quarters Negative    18. Sheep Sorrell Negative    19. Rough Pigweed Negative    20. Marsh Elder, Rough Negative    21. Mugwort, Common Negative    22. Ash mix Negative    23. Birch mix Negative    24. Beech American Negative    25. Box, Elder Negative    26. Cedar, red Negative    27. Cottonwood, Guinea-Bissau Negative    28. Elm mix Negative    29. Hickory Negative    30. Maple mix Negative    31. Oak, Guinea-[place]au mix Negative    32. Pecan Pollen Negative    33. Pine mix Negative    34. Sycamore Eastern Negative    35. Walnut, Black Pollen Negative    36. Alternaria alternata Negative    37. Cladosporium Herbarum Negative    38. Aspergillus mix Negative    39. Penicillium mix Negative    40. Bipolaris sorokiniana (Helminthosporium) Negative    41. Drechslera spicifera (Curvularia) Negative    42. Mucor plumbeus Negative    43. Fusarium moniliforme Negative    44. Aureobasidium pullulans (pullulara) Negative    45. Rhizopus oryzae Negative    46. Botrytis cinera Negative    47. Epicoccum nigrum Negative    48. Phoma betae Negative    49. Candida Albicans Negative    50. Trichophyton mentagrophytes Negative    51. Mite, D Farinae  5,000 AU/ml Negative     52. Mite, D Pteronyssinus  5,000 AU/ml Negative    53. Cat Hair 10,000 BAU/ml Negative    54.  Dog Epithelia Negative    55. Mixed Feathers Negative    56. Horse Epithelia Negative    57. Cockroach, German Negative    58. Mouse Negative    59. Tobacco Leaf Negative             Intradermal - 10/12/21 1029     Time Antigen Placed 1015    Allergen Manufacturer Waynette Buttery    Location Arm    Number of Test 15    Intradermal Select    Control Negative    French Southern Territories Negative    Johnson Negative    7 Grass Negative    Ragweed mix Negative    Weed mix Negative    Tree mix Negative    Mold 1 Negative    Mold 2 Negative    Mold 3 Negative    Mold 4 Negative    Cat Negative    Dog Negative    Cockroach Negative    Mite mix Negative  Allergy testing results were read and interpreted by myself, documented by clinical staff.         Salvatore Marvel, MD [facility]

## 2021-10-18 LAB — CBC WITH DIFFERENTIAL
Basophils Absolute: 0.1 10*3/uL (ref 0.0–0.2)
Basos: 1 %
EOS (ABSOLUTE): 0.3 10*3/uL (ref 0.0–0.4)
Eos: 6 %
Hematocrit: 47.1 % (ref 37.5–51.0)
Hemoglobin: 15.8 g/dL (ref 13.0–17.7)
Immature Grans (Abs): 0 10*3/uL (ref 0.0–0.1)
Immature Granulocytes: 0 %
Lymphocytes Absolute: 1 10*3/uL (ref 0.7–3.1)
Lymphs: 19 %
MCH: 30.7 pg (ref 26.6–33.0)
MCHC: 33.5 g/dL (ref 31.5–35.7)
MCV: 92 fL (ref 79–97)
Monocytes Absolute: 0.4 10*3/uL (ref 0.1–0.9)
Monocytes: 7 %
Neutrophils Absolute: 3.8 10*3/uL (ref 1.4–7.0)
Neutrophils: 67 %
RBC: 5.14 x10E6/uL (ref 4.14–5.80)
RDW: 12.3 % (ref 11.6–15.4)
WBC: 5.6 10*3/uL (ref 3.4–10.8)

## 2021-10-18 LAB — IGG, IGA, IGM
IgA/Immunoglobulin A, Serum: 145 mg/dL (ref 61–437)
IgG (Immunoglobin G), Serum: 798 mg/dL (ref 603–1613)
IgM (Immunoglobulin M), Srm: 108 mg/dL (ref 20–172)

## 2021-10-18 LAB — STREP PNEUMONIAE 23 SEROTYPES IGG
Pneumo Ab Type 1*: 1.5 ug/mL (ref >1.3)
Pneumo Ab Type 12 (12F)*: <0.1 ug/mL — ABNORMAL LOW (ref >1.3)
Pneumo Ab Type 14*: >18.7 ug/mL (ref >1.3)
Pneumo Ab Type 17 (17F)*: 0.6 ug/mL — ABNORMAL LOW (ref >1.3)
Pneumo Ab Type 19 (19F)*: 9.9 ug/mL (ref >1.3)
Pneumo Ab Type 2*: 2 ug/mL (ref >1.3)
Pneumo Ab Type 20*: 2.2 ug/mL (ref >1.3)
Pneumo Ab Type 22 (22F)*: 1.8 ug/mL (ref >1.3)
Pneumo Ab Type 23 (23F)*: 0.4 ug/mL — ABNORMAL LOW (ref >1.3)
Pneumo Ab Type 26 (6B)*: >41.4 ug/mL (ref >1.3)
Pneumo Ab Type 3*: 0.4 ug/mL — ABNORMAL LOW (ref >1.3)
Pneumo Ab Type 34 (10A)*: 0.9 ug/mL — ABNORMAL LOW (ref >1.3)
Pneumo Ab Type 4*: 0.2 ug/mL — ABNORMAL LOW (ref >1.3)
Pneumo Ab Type 43 (11A)*: 1.6 ug/mL (ref >1.3)
Pneumo Ab Type 5*: 10.2 ug/mL (ref >1.3)
Pneumo Ab Type 51 (7F)*: 0.1 ug/mL — ABNORMAL LOW (ref >1.3)
Pneumo Ab Type 54 (15B)*: 3.1 ug/mL (ref >1.3)
Pneumo Ab Type 56 (18C)*: >8.1 ug/mL (ref >1.3)
Pneumo Ab Type 57 (19A)*: 1 ug/mL — ABNORMAL LOW (ref >1.3)
Pneumo Ab Type 68 (9V)*: 0.1 ug/mL — ABNORMAL LOW (ref >1.3)
Pneumo Ab Type 70 (33F)*: >10.1 ug/mL (ref >1.3)
Pneumo Ab Type 8*: 1.6 ug/mL (ref >1.3)
Pneumo Ab Type 9 (9N)*: 0.3 ug/mL — ABNORMAL LOW (ref >1.3)

## 2021-10-18 LAB — COMPLEMENT, TOTAL: Compl, Total (CH50): >60 U/mL (ref >41)

## 2021-10-18 LAB — DIPHTHERIA / TETANUS ANTIBODY PANEL
Diphtheria Ab: 0.46 IU/mL (ref <0.10)
Tetanus Ab, IgG: 2.88 IU/mL (ref <0.10)

## 2021-12-09 ENCOUNTER — Other Ambulatory Visit (HOSPITAL_COMMUNITY): Payer: Self-pay

## 2022-01-13 ENCOUNTER — Encounter: Payer: Self-pay | Admitting: Family Medicine

## 2022-01-13 ENCOUNTER — Ambulatory Visit (INDEPENDENT_AMBULATORY_CARE_PROVIDER_SITE_OTHER): Payer: Medicare Other | Admitting: Family Medicine

## 2022-01-13 ENCOUNTER — Telehealth: Payer: Self-pay | Admitting: *Deleted

## 2022-01-13 VITALS — BP 140/76 | HR 70 | Temp 97.6°F | Resp 16

## 2022-01-13 DIAGNOSIS — B999 Unspecified infectious disease: Secondary | ICD-10-CM

## 2022-01-13 DIAGNOSIS — J310 Chronic rhinitis: Secondary | ICD-10-CM | POA: Diagnosis not present

## 2022-01-13 NOTE — Progress Notes (Signed)
[address] [place] [address] Dept: [phone]  FOLLOW UP NOTE  Patient ID: Ruben Donovan, male    DOB: Nov 22, 1950  Age: 71 y.o. MRN: [id number] Date of Office Visit: 01/13/2022  Assessment  Chief Complaint: Follow-up  HPI Ruben Donovan is a 71 year old male who presents to the clinic for follow-up visit.  He was last seen in this clinic on 10/12/2021 by Dr. Dellis Anes.  For evaluation of nonallergic rhinitis, recurrent infection, and atopic dermatitis for whom he sees Dr. Michaell Cowing, dermatology specialist.  At today's visit, he reports his chronic rhinitis has been moderately well controlled with symptoms including clear rhinorrhea when the weather is cold, nasal congestion occurring mostly in the morning, occasional sneezing, and copious postnasal drainage with frequent throat clearing.  He is not currently taking an antihistamine, using a steroid nasal spray, or using a saline nasal spray.  His last environmental allergy skin testing was on 10/12/2021 and was negative to the adult environmental panel. He reports occasional red and itchy eyes for which he is not currently using any medical intervention. He continues to follow with Dr. Sherene Sires for evaluation and treatment of his breathing.  At today's visit, he reports occasional shortness of breath, dry cough, and wheeze.  He reports the symptoms have improved dramatically over the last several months and he has not used an inhaler over the last several months.  He denies symptoms of reflux including heartburn or vomiting.  He denies any infections or antibiotic use since his last visit to this clinic.  He does report that he believes he had Pneumovax injection about the time that he was 62 and he thinks that he had Prevnar 20 within the last year.  He is agreeable to release of medical record from his primary care provider to complete our documentation to ensure he has complete vaccine protection. His current medications are listed in the  chart.    Drug Allergies:  Allergies  Allergen Reactions   Clonidine Derivatives    Clonidine Other (See Comments)   Hydralazine Other (See Comments)    Physical Exam: BP (!) 140/76   Pulse 70   Temp 97.6 F (36.4 C)   Resp 16   SpO2 97%    Physical Exam Vitals reviewed.  Constitutional:      Appearance: Normal appearance.  HENT:     Head: Normocephalic and atraumatic.     Right Ear: Tympanic membrane normal.     Left Ear: Tympanic membrane normal.     Nose:     Comments: Bilateral nares slightly erythematous with clear nasal drainage noted.  Pharynx slightly erythematous with no exudate.  Ears normal.  Eyes normal. Eyes:     Conjunctiva/sclera: Conjunctivae normal.  Cardiovascular:     Rate and Rhythm: Normal rate and regular rhythm.     Heart sounds: Normal heart sounds. No murmur heard. Pulmonary:     Effort: Pulmonary effort is normal.     Breath sounds: Normal breath sounds.     Comments: Lungs clear to auscultation Musculoskeletal:        General: Normal range of motion.     Cervical back: Normal range of motion and neck supple.  Skin:    General: Skin is warm and dry.  Neurological:     Mental Status: He is alert and oriented to person, place, and time.  Psychiatric:        Mood and Affect: Mood normal.        Behavior: Behavior normal.  Thought Content: Thought content normal.        Judgment: Judgment normal.     Assessment and Plan: 1. Non-allergic rhinitis   2. Recurrent infections     Patient Instructions  Nonallergic rhinitis/conjunctivitis Continue Flonase 2 sprays in each nostril once a day as needed for a stuffy nose Continue azelastine 2 sprays in each nostril twice a day as needed for runny nose Consider saline nasal rinses as needed for nasal symptoms. Use this before any medicated nasal sprays for best result Some over the counter eye drops include Pataday one drop in each eye once a day as needed for red, itchy eyes OR Zaditor  one drop in each eye twice a day as needed for red itchy eyes.  Recurrent infection Keep track of infections, antibiotics, and prednisone use We are going to request your vaccine history from your primary care provider to make sure you have received all the appropriate vaccine doses for full protection Call the clinic if this treatment plan is not working well for you.  Follow up in 6-12 months or sooner if needed.  Return in about 6 months (around 07/15/2022), or if symptoms worsen or fail to improve.    Thank you for the opportunity to care for this patient.  Please do not hesitate to contact me with questions.  Gareth Morgan, FNP [facility]

## 2022-01-13 NOTE — Patient Instructions (Addendum)
Nonallergic rhinitis/conjunctivitis Continue Flonase 2 sprays in each nostril once a day as needed for a stuffy nose Continue azelastine 2 sprays in each nostril twice a day as needed for runny nose Consider saline nasal rinses as needed for nasal symptoms. Use this before any medicated nasal sprays for best result Some over the counter eye drops include Pataday one drop in each eye once a day as needed for red, itchy eyes OR Zaditor one drop in each eye twice a day as needed for red itchy eyes.  Recurrent infection Keep track of infections, antibiotics, and prednisone use We are going to request your vaccine history from your primary care provider to make sure you have received all the appropriate vaccine doses for full protection Call the clinic if this treatment plan is not working well for you.  Follow up in 6-12 months or sooner if needed.

## 2022-01-13 NOTE — Telephone Encounter (Signed)
Medical Records Release has been faxed to patient's PCP Lorelei Pont, DO to request vaccine history. Record Release has been placed in the red accordion folder in the [place] office.

## 2022-02-09 NOTE — Telephone Encounter (Signed)
Will re-fax medical records release tomorrow to PCP office.

## 2022-02-10 NOTE — Telephone Encounter (Signed)
Records have bene refaxed to PCP [facility]. Will call next week to follow up and ensure that they received the request.

## 2022-02-15 NOTE — Telephone Encounter (Signed)
Medical Records from PCP [facility] have been received.

## 2022-02-16 ENCOUNTER — Telehealth: Payer: Self-pay | Admitting: *Deleted

## 2022-02-16 ENCOUNTER — Telehealth: Payer: Self-pay

## 2022-02-16 NOTE — Telephone Encounter (Signed)
-----   Message from Dara Hoyer, FNP sent at 02/16/2022  9:28 AM EST ----- Can you please let this patient know that we did receive his immunization record from his PCP. Looks like he had a PPSV in 2020. With that in mind, he should receive a PVC20 at anytime in the future and the pneumococcal series will be complete for him. Thank you

## 2022-02-16 NOTE — Telephone Encounter (Signed)
I called the patient and left a message for him to call the office back to go over previous note per Webb Silversmith.

## 2022-02-16 NOTE — Telephone Encounter (Signed)
Error

## 2022-02-17 NOTE — Telephone Encounter (Signed)
Patient returned call - DOB verified - advised of provider notation below.  Patient stated he will check with [facility] and his PCP because he thinks he's already had the Prevnar 20 vaccine as well.  Patient advised to contact the office he has had the vaccine to give [place] the information to add to immunizations records.  Patient verbalized understanding, no further questions.

## 2022-05-11 ENCOUNTER — Other Ambulatory Visit: Payer: Self-pay | Admitting: Internal Medicine

## 2022-05-12 ENCOUNTER — Other Ambulatory Visit: Payer: Self-pay | Admitting: Internal Medicine

## 2022-05-12 NOTE — Telephone Encounter (Signed)
OK refill irbesartan x 2.  Last OV 07/12/2021.  Per OV note: Pulmonary follow up is as needed

## 2022-06-15 ENCOUNTER — Other Ambulatory Visit: Payer: Self-pay | Admitting: Internal Medicine

## 2022-09-13 ENCOUNTER — Ambulatory Visit (INDEPENDENT_AMBULATORY_CARE_PROVIDER_SITE_OTHER): Payer: Medicare Other | Admitting: Allergy & Immunology

## 2022-09-13 ENCOUNTER — Encounter: Payer: Self-pay | Admitting: Allergy & Immunology

## 2022-09-13 ENCOUNTER — Other Ambulatory Visit: Payer: Self-pay

## 2022-09-13 VITALS — BP 130/74 | HR 79 | Temp 98.6°F | Resp 20 | Ht 63.0 in | Wt 136.0 lb

## 2022-09-13 DIAGNOSIS — J310 Chronic rhinitis: Secondary | ICD-10-CM | POA: Diagnosis not present

## 2022-09-13 DIAGNOSIS — Z91018 Allergy to other foods: Secondary | ICD-10-CM | POA: Diagnosis not present

## 2022-09-13 DIAGNOSIS — B999 Unspecified infectious disease: Secondary | ICD-10-CM

## 2022-09-13 MED ORDER — EPINEPHRINE 0.3 MG/0.3ML IJ SOAJ: 0.3000 mg | INTRAMUSCULAR | 1 refills | Status: AC | PRN

## 2022-09-13 NOTE — Progress Notes (Signed)
FOLLOW UP  Date of Service/Encounter:  09/13/22   Assessment:   Non-allergic rhinitis   Recurrent infections - with history of an invasive staphylococcal infection in 2016   COVID-19 infection (August 2022) - managed as outpatient  Alpha gal syndrome  Plan/Recommendations:   1. Non-allergic rhinitis - Symptoms seem well controlled   2. Recurrent infections - Symptoms seem well controlled. - I do not think that we need to do any further workup.   3. Alpha gal syndrome  - It is safest to just avoid all red meat.  - Information on alpha gal provided. - EpiPen training provided. - Emergency anaphylaxis plan provided.  - I rarely recommended that patients avoid dairy, so I would keep that in your diet.  - We are going to get some labs from your GI doctor so we have the numbers.  - We will recheck every 6-12 months to see if you are losing the sensitization.   4. Return in about 6 months (around 03/16/2023).   Subjective:   Ruben Donovan is a 72 y.o. 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  Ruben Donovan is a 72 y.o. male presenting for a follow up visit.  He was last seen in November 2023.  At that time, he was continued on  Flonase and Astelin as well as nasal saline rinses.  He was encouraged to keep track of his antibiotic and prednisone use.  Review of his labs show that we did labs in September 2023.  Everything looked very good.  He was protective to 12 out of 23 serotypes which was fairly decent.  Since last visit, he has done well from an infectious standpoint. He does report that he was recently diagnosed with Ruben Donovan syndrome. He has been having some testing done that showed a "meat allergy". We do not have his labs today. He id seen by Dr. Naomie Dean at [facility]. We are going to get records.   He does generally feel better since avoiding red meat. She would like some more information.  Otherwise, there have been no changes to his past medical history, surgical history, family history, or social history.    Review of systems otherwise negative other than that mentioned in the HPI.    Objective:   Blood pressure 130/74, pulse 79, temperature 98.6 F (37 C), resp. rate 20, height 5\' 3"  (1.6 m), weight 136 lb (61.7 kg), SpO2 97%. Body mass index is 24.09 kg/m.    Physical Exam Vitals reviewed.  Constitutional:      Appearance: He is well-developed.  HENT:     Head: Normocephalic and atraumatic.     Right Ear: Tympanic membrane, ear canal and external ear normal. No drainage, swelling or tenderness. Tympanic membrane  is not injected, scarred, erythematous, retracted or bulging.     Left Ear: Tympanic membrane, ear canal and external ear normal. No drainage, swelling or tenderness. Tympanic membrane is not injected, scarred, erythematous, retracted or bulging.     Nose: No nasal deformity, septal deviation, mucosal edema or rhinorrhea.     Right Turbinates: Enlarged, swollen and pale.     Left Turbinates: Enlarged, swollen and pale.     Right Sinus: No maxillary sinus tenderness or frontal sinus tenderness.     Left Sinus: No maxillary sinus tenderness or frontal sinus tenderness.      Comments: No nasal polyps noted.     Mouth/Throat:     Lips: Pink.     Mouth: Mucous membranes are moist. Mucous membranes are not pale and not dry.     Pharynx: Uvula midline.  Eyes:     General: Lids are normal. Allergic shiner present.        Right eye: No discharge.        Left eye: No discharge.     Conjunctiva/sclera: Conjunctivae normal.     Right eye: Right conjunctiva is not injected. No chemosis.    Left eye: Left conjunctiva is not injected. No chemosis.    Pupils: Pupils are equal, round, and reactive to light.  Cardiovascular:     Rate and Rhythm: Normal rate and regular rhythm.     Heart sounds: Normal heart sounds.  Pulmonary:     Effort: Pulmonary effort is normal. No tachypnea, accessory muscle usage or respiratory distress.     Breath sounds: Normal breath sounds. No wheezing, rhonchi or rales.     Comments: Moving air well in all lung fields. No increased work of breathing noted.  Chest:     Chest wall: No tenderness.  Abdominal:     Tenderness: There is no abdominal tenderness. There is no guarding or rebound.  Lymphadenopathy:     Head:     Right side of head: No submandibular, tonsillar or occipital adenopathy.     Left side of head: No submandibular, tonsillar or occipital adenopathy.     Cervical: No cervical adenopathy.  Skin:    General: Skin is warm.     Capillary Refill: Capillary refill takes less than 2 seconds.     Coloration: Skin is not pale.     Findings: No abrasion, erythema, petechiae or rash. Rash is not papular, urticarial or vesicular.  Neurological:     Mental Status: He is alert.  Psychiatric:        Behavior: Behavior is cooperative.      Diagnostic studies: none      Malachi Bonds, MD  [facility]

## 2022-09-13 NOTE — Patient Instructions (Addendum)
1. Non-allergic rhinitis - Symptoms seem well controlled   2. Recurrent infections - Symptoms seem well controlled. - I do not think that we need to do any further workup.   3. Alpha gal syndrome  - It is safest to just avoid all red meat.  - Information on alpha gal provided. - EpiPen training provided. - Emergency anaphylaxis plan provided.  - I rarely recommended that patients avoid dairy, so I would keep that in your diet.  - We are going to get some labs from your GI doctor so we have the numbers.  - We will recheck every 6-12 months to see if you are losing the sensitization.   4. Return in about 6 months (around 03/16/2023).    Please inform us of any Emergency Department visits, hospitalizations, or changes in symptoms. Call us before going to the ED for breathing or allergy symptoms since we might be able to fit you in for a sick visit. Feel free to contact us anytime with any questions, problems, or concerns.  It was a pleasure to see you guys today!  Websites that have reliable patient information: 1. American Academy of Asthma, Allergy, and Immunology: www.aaaai.org 2. Food Allergy Research and Education (FARE): foodallergy.org 3. Mothers of Asthmatics: http://www.asthmacommunitynetwork.org 4. American College of Allergy, Asthma, and Immunology: www.acaai.org   COVID-19 Vaccine Information can be found at: [email or web address] For questions related to vaccine distribution or appointments, please email [email or web address] or call [phone].   We realize that you might be concerned about having an allergic reaction to the COVID19 vaccines. To help with that concern, WE ARE OFFERING THE COVID19 VACCINES IN OUR OFFICE! Ask the front desk for dates!     "Like" [place] on Facebook and Instagram for our latest updates!      A healthy democracy works best when [employer] participate! Make sure you are registered to  vote! If you have moved or changed any of your contact information, you will need to get this updated before voting!  In some cases, you MAY be able to register to vote online: [email or web address]     Alpha-gal and Red Meat Allergy   Overview An allergy to "alpha-gal" refers to having a severe and potentially life-threatening allergy to a carbohydrate molecule called galactose-alpha-1,3-galactose that is found in most mammalian or "red meat". Unlike other food allergies which typically occur within minutes of ingestion, symptoms from eating red meat such as pork, lamb or beef may be delayed, occurring 3-8 hours after eating. Most food allergies are directed against a protein molecule, but alpha-gal is unusual because it is a carbohydrate, and a delay in its absorption may explain the delay in symptoms.  What are the symptoms of an alpha-gal allergy? As with other food allergies, signs or symptoms of an allergy to alpha-gal may include: Hives and itching  Swelling of your lips, face or eyelids  Shortness of breath, cough or wheezing  Abdominal pain, nausea, diarrhea or vomiting The most severe reaction, anaphylaxis, can present as a combination of several of these symptoms, may include low blood pressure, and is potentially fatal.  Because these symptoms are delayed, you may only wake up with them in the middle of the night after an evening meal.  How is an alpha-gal allergy diagnosed? Diagnosis of this allergy starts with your allergist taking an appropriate history and physical examination. Because the onset is usually quite delayed, it can be hard to associate the symptoms with eating red meat many hours previously. Triggers include  any red meat - including beef, pork, lamb or even horse products. It may occur after eating hotdogs and hamburgers. In very rare cases the reaction may extend to milk or dairy proteins and gelatin.  Your allergist may recommend  testing that includes skin tests to the relevant animal proteins and blood tests which measure the levels of a specific immunoglobulin E (IgE) antibody, to mammalian meats. An investigational blood test, IgE against alpha-gal itself, may also aid in the diagnosis.  How is an alpha-gal allergy treated? Immediate symptoms such as hives or shortness of breath are treated the same as any other food allergy - in an urgent care setting with anti-histamines, epinephrine and other medications. Prevention long-term involves avoidance of all red meat in sensitized individuals. You may be advised to carry an epinephrine auto-injector, to be used in case of subsequent accidental exposures and reaction. These measures do not necessarily mean switching to a full vegetarian diet, since poultry and fish can be consumed and do not cause similar reactions. As with other food allergies, there is the possibility that over time the sensitivity diminishes - although these changes may take many years to become apparent.  How do you become allergic to alpha-gal? Alpha-gal is a molecule carried in the saliva of the [place] tick and other potential arthropods typically after feeding on mammalian blood. People that are bitten by the tick, especially those that are bitten repeatedly, are at risk of becoming sensitized and producing the IgE necessary to then cause allergic reactions. Interestingly, allergic reactions may occur to red meat, to subsequent tick bites, and even to medications that contain alpha-gal. Cetuximab is a cancer medication that contains alpha-gal, and people who have had allergic reactions to this medication (these are typically immediate reactions, because it is infused intravenously) have a higher risk for red meat allergy and are likely to have been bitten by ticks in the past. As might be expected, the incidence of tick bites is much higher in the [place] the traditional habitat for the tick.  However, cases are now increasingly reported in the [place] and [place] states. And it is a phenomenon that has been observed worldwide, with different ticks responsible for similar cases of red meat allergy in many other countries such as [place], [place] and [place].  The discovery of this peculiar allergy has allowed researchers to correlate tick bites with many cases of anaphylaxis that would previously have been classified as 'idiopathic', or of unknown cause. Also, while it was originally thought that the [employer] tick had to feast on mammalian blood in order to carry the alpha-gal molecule, more recent research has shown that it may carry this molecule and be capable of sensitizing humans independently.  How do you prevent an alpha-gal allergy? Because this allergy is predominantly tick born, you are more likely at risk if you often go outdoors in wooded areas for activities such as hiking, fishing or hunting. The key strategy is to prevent tick bites. This may include wearing long sleeved shirts or pants, using appropriate insect repellants, and surveying for ticks after spending time outdoors. Any observed ticks should be removed carefully by cleaning the site with rubbing alcohol, then using tweezers to pull the tick's head up carefully from the skin using steady pressure. Clean your hands and the site one more time and make sure not to crush the tick between your fingers.

## 2022-09-14 ENCOUNTER — Other Ambulatory Visit (HOSPITAL_COMMUNITY): Payer: Self-pay

## 2022-09-15 ENCOUNTER — Other Ambulatory Visit: Payer: Self-pay | Admitting: Internal Medicine

## 2022-09-29 NOTE — Progress Notes (Signed)
We did get his outside records from the [place].  His alpha gal level was 0.41 in July 2024.  He had a ultrasound of his liver that showed hepatic steatosis.  He had an endoscopy that showed scattered lymphangiectasis as well as a diminutive polyp.  Celiac testing was negative.  Calprotectin was normal.  Pancreatic elastase was normal.  TSH was normal.  Results will be scanned into the system.

## 2022-12-13 ENCOUNTER — Other Ambulatory Visit: Payer: Self-pay | Admitting: Internal Medicine

## 2023-03-21 ENCOUNTER — Other Ambulatory Visit: Payer: Self-pay

## 2023-03-21 ENCOUNTER — Ambulatory Visit (INDEPENDENT_AMBULATORY_CARE_PROVIDER_SITE_OTHER): Payer: Medicare Other | Admitting: Allergy & Immunology

## 2023-03-21 ENCOUNTER — Encounter: Payer: Self-pay | Admitting: Allergy & Immunology

## 2023-03-21 VITALS — BP 136/72 | HR 76 | Temp 98.7°F | Resp 16

## 2023-03-21 DIAGNOSIS — B999 Unspecified infectious disease: Secondary | ICD-10-CM

## 2023-03-21 DIAGNOSIS — Z91018 Allergy to other foods: Secondary | ICD-10-CM

## 2023-03-21 DIAGNOSIS — J310 Chronic rhinitis: Secondary | ICD-10-CM

## 2023-03-21 NOTE — Patient Instructions (Addendum)
 1. Non-allergic rhinitis - Symptoms seem well controlled   2. Recurrent infections - Symptoms seem well controlled. - I do not think that we need to do any further workup.   3. 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  - It was pretty low last time, so I think you might have lost it.  - Information on alpha gal provided. - EpiPen  training provided. - Emergency anaphylaxis plan is up to date.  4. Return if symptoms worsen or fail to improve.    Please inform us  of any Emergency Department visits, hospitalizations, or changes in symptoms. Call us  before going to the ED for breathing or allergy  symptoms since we might be able to fit you in for a sick visit. Feel free to contact us  anytime with any questions, problems, or concerns.  It was a pleasure to see you again today!  Websites that have reliable patient information: 1. American Academy of Asthma, Allergy , and Immunology: www.aaaai.org 2. Food Allergy  Research and Education (FARE): foodallergy.org 3. Mothers of Asthmatics: http://www.asthmacommunitynetwork.org 4. American College of Allergy , Asthma, and Immunology: www.acaai.org      "Like" us  on Facebook and Instagram for our latest updates!      A healthy democracy works best when [employer] participate! Make sure you are registered to vote! If you have moved or changed any of your contact information, you will need to get this updated before voting! Scan the QR codes below to learn more!

## 2023-03-21 NOTE — Progress Notes (Signed)
 FOLLOW UP  Date of Service/Encounter:  03/21/23   Assessment:   Non-allergic rhinitis   Recurrent infections - with history of an invasive staphylococcal infection in 2016   COVID-19 infection (August 2022) - managed as outpatient   Alpha gal syndrome - rechecking levels today  Bertrum syndrome   Overall, Ruben Donovan seems to be doing well.  He did avoid red meat for a month, but this did not really change his courses of diarrhea.  It seems to stabilized overall.  He does report feeling a lot better.  He has a history of a cough that is since resolved.  He does not have evidence of environmental allergies on testing.  His infections seem to have decreased in frequency and he has not had antibiotics since I first saw him.  Therefore, I do not think that I really need to see him on a regular basis.  I told him I be happy to see him again if something else arises, but at this point I think he is under good control of his symptoms.  He does need to establish care with another gastroenterologist since his left, but he is going to take care of that.  Happy to refill his EpiPen  if his alpha gal level comes back higher, but he is not even avoiding it now and has never had anaphylaxis, so I do not think there is much need for it at this point.  Plan/Recommendations:   1. Non-allergic rhinitis - Symptoms seem well controlled   2. Recurrent infections - Symptoms seem well controlled. - I do not think that we need to do any further workup.   3. 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  - It was pretty low last time, so I think you might have lost it.  - Information on alpha gal provided. - EpiPen  training provided. - Emergency anaphylaxis plan is up to date.  4. Return if symptoms worsen or fail to improve.    Subjective:   Ruben Donovan is a 73 y.o. 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  Discussed the use of AI scribe software for clinical note transcription with the patient and/or guardian, who gave verbal consent to proceed.  Ruben Donovan is a 73 y.o. male presenting for a follow up visit.  He was last seen in July 2024.  At that time, he has been undergoing 24-hour walk home.  Recurrent infections, he seems to be doing well without recurrent infections.  We recommended continuing to avoid all red meat.  I did tell him to keep.  His diet.  He has an alpha gal level of 0.41 in the past.  This is  a fragility.  24.  Ultrasound of his liver showed steatosis.  Endoscopy showed scattered lymphangiectasia as well as a follow-up.  Celiac testing was negative.  Calprotectin was normal.  Pancreatic elastase normal.  TSH was normal.  Since the last visit, he has done well.   He has been avoiding red meat for about a month due to alpha-gal syndrome and feels significantly better. He has been able to consume red meat occasionally without significant issues. His previous alpha-gal levels were low, and he is uncertain about the current status of his condition.  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 He experiences diarrhea  approximately three to four times a day, often accompanied by gas. Avoiding red meat did not significantly change his diarrhea symptoms, which he describes as 'bad' but manageable. Overall symptoms have improved.  He is eating red meat around 1-2 times a week and does not notice any changes in his symptoms.  He recalls having a severe coughing spasm in November or December 2024, which he attributes to a viral infection.  He did not require any steroids or antibiotics for symptoms.  He has an inhaler but does not use it.  He no longer sees Dr. Darlean. He has not been sick frequently and does not recall the last time he took antibiotics.   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  He has a lot of hobbies and was very happy to be able to do more hunting this past year.  Otherwise, there have been no changes to his past medical history, surgical history, family history, or social history.    Review of systems otherwise negative other than that mentioned in the HPI.    Objective:   Blood pressure 136/72, pulse 76, temperature 98.7 F (37.1 C), temperature source Temporal, resp. rate 16, SpO2 98%. There is no height or weight on file to calculate BMI.    Physical Exam Vitals reviewed.  Constitutional:      Appearance: He is well-developed.  HENT:     Head: Normocephalic and atraumatic.     Comments: Very pleasant.     Right Ear: Tympanic membrane, ear canal and external ear normal. No drainage, swelling or tenderness. Tympanic membrane is not injected, scarred, erythematous, retracted or bulging.     Left Ear: Tympanic membrane, ear canal and external ear normal. No drainage, swelling or tenderness. Tympanic membrane is not injected, scarred, erythematous, retracted or bulging.     Nose: No nasal deformity, septal deviation, mucosal edema or rhinorrhea.      Right Turbinates: Enlarged, swollen and pale.     Left Turbinates: Enlarged, swollen and pale.     Right Sinus: No maxillary sinus tenderness or frontal sinus tenderness.     Left Sinus: No maxillary sinus tenderness or frontal sinus tenderness.     Comments: No nasal polyps noted.     Mouth/Throat:     Lips: Pink.     Mouth: Mucous membranes are moist. Mucous membranes are not pale and not dry.     Pharynx: Uvula midline.  Eyes:     General: Lids are normal. Allergic shiner present.        Right eye: No discharge.        Left eye: No discharge.     Conjunctiva/sclera: Conjunctivae normal.  Right eye: Right conjunctiva is not injected. No chemosis.    Left eye: Left conjunctiva is not injected. No chemosis.    Pupils: Pupils are equal, round, and reactive to light.  Cardiovascular:     Rate and Rhythm: Normal rate and regular rhythm.     Heart sounds: Normal heart sounds.  Pulmonary:     Effort: Pulmonary effort is normal. No tachypnea, accessory muscle usage or respiratory distress.     Breath sounds: Normal breath sounds. No wheezing, rhonchi or rales.     Comments: Moving air well in all lung fields. No increased work of breathing noted.  Chest:     Chest wall: No tenderness.  Lymphadenopathy:     Head:     Right side of head: No submandibular, tonsillar or occipital adenopathy.     Left side of head: No submandibular, tonsillar or occipital adenopathy.     Cervical: No cervical adenopathy.  Skin:    General: Skin is warm.     Capillary Refill: Capillary refill takes less than 2 seconds.     Coloration: Skin is not pale.     Findings: No abrasion, erythema, petechiae or rash. Rash is not papular, urticarial or vesicular.  Neurological:     Mental Status: He is alert.  Psychiatric:        Behavior: Behavior is cooperative.      Diagnostic studies: labs sent instead      Marty Shaggy, MD  [facility] 

## 2023-03-24 LAB — ALPHA-GAL PANEL
Allergen Lamb IgE: <0.1 kU/L
Beef IgE: 0.15 kU/L — AB
IgE (Immunoglobulin E), Serum: 69 [IU]/mL (ref 6–495)
O215-IgE Alpha-Gal: 0.18 kU/L — AB
Pork IgE: <0.1 kU/L

## 2023-06-10 IMAGING — CT CT ANGIO CHEST
2 of 6 series · 18 of 46 positions shown · IV contrast (agent unspecified)
Comparison: None.

CLINICAL DATA: Pulmonary embolism (PE) suspected, high prob

EXAM:
CT ANGIOGRAPHY CHEST WITH CONTRAST
TECHNIQUE: Multidetector CT imaging of the chest was performed using the
standard protocol during bolus administration of intravenous
contrast. Multiplanar CT image reconstructions and MIPs were
obtained to evaluate the vascular anatomy.

[Series 6: thins · axial · 0.66mm/px · z∈[+620,+891]mm · 15 of 426 slices shown]
[im 19/426  lung]
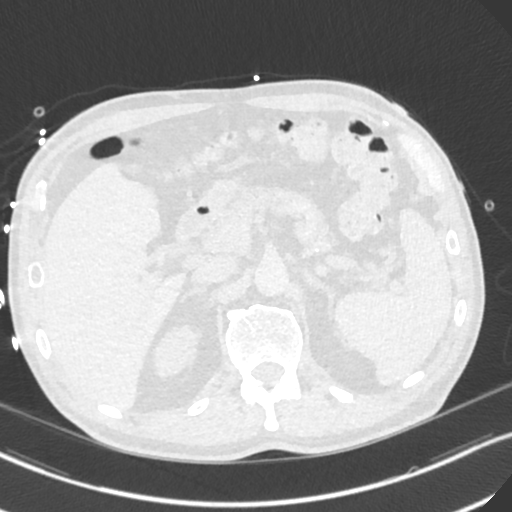
[im 56/426  soft-tissue]
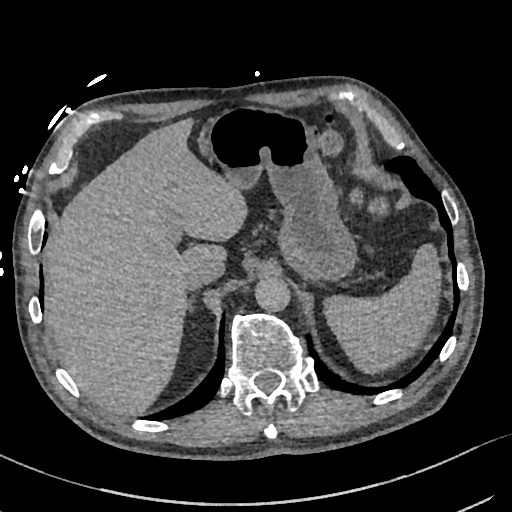
[im 74/426  lung]
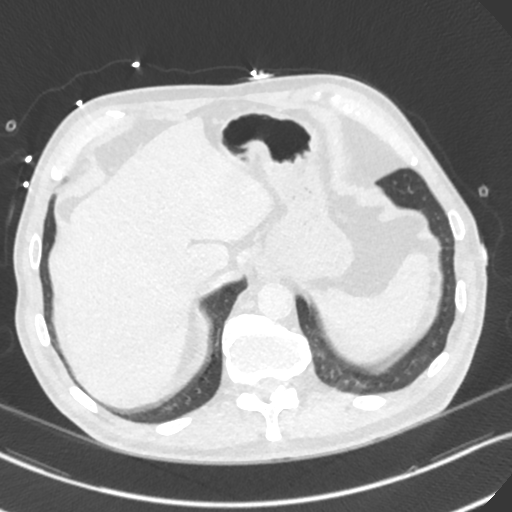
[im 111/426  soft-tissue]
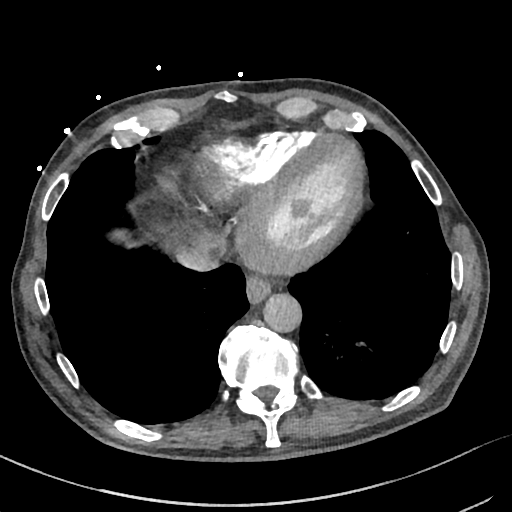
[im 130/426  lung]
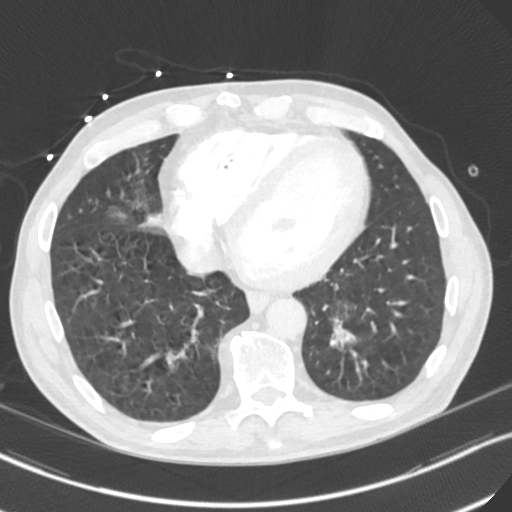
[im 167/426  soft-tissue]
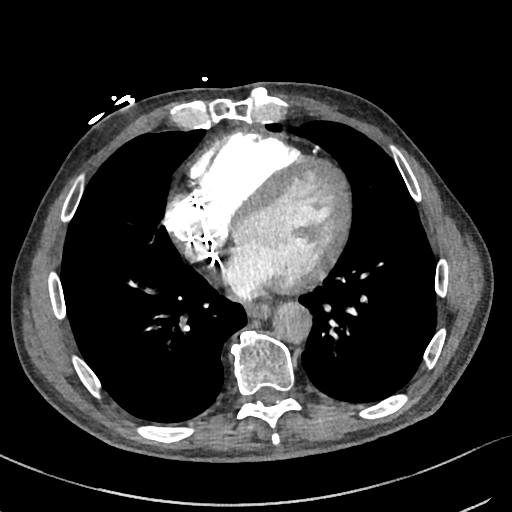
[im 185/426  lung]
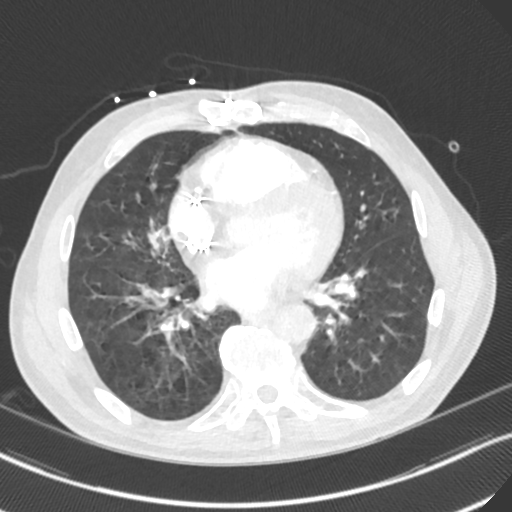
[im 222/426  soft-tissue]
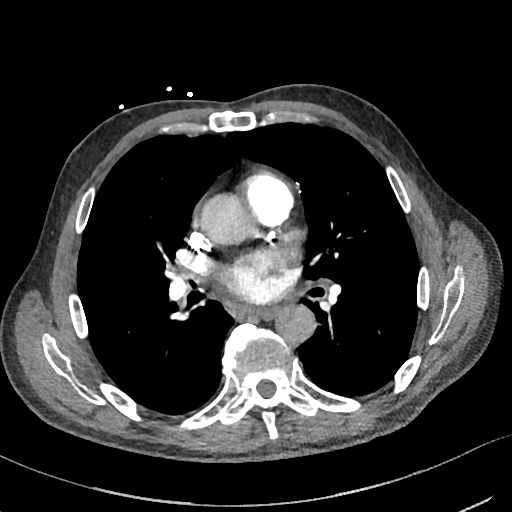
[im 241/426  lung]
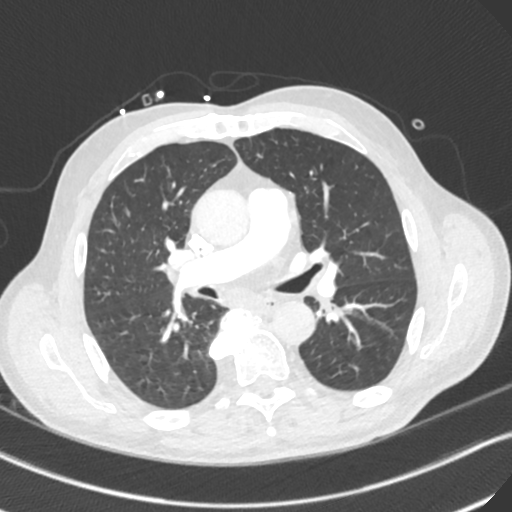
[im 259/426  soft-tissue]
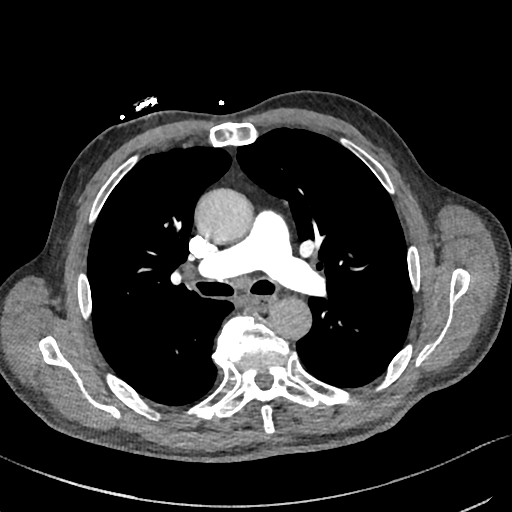
[im 296/426  lung]
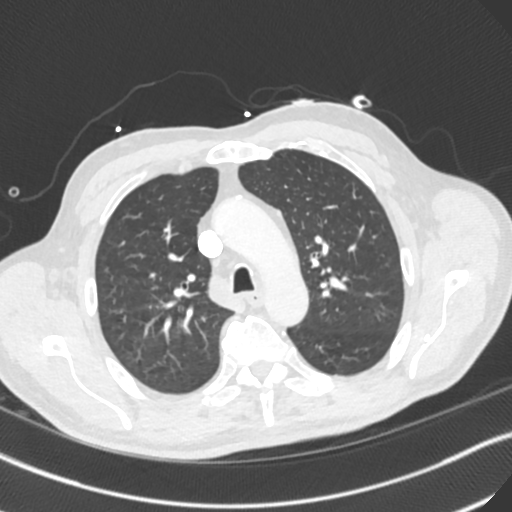
[im 315/426  soft-tissue]
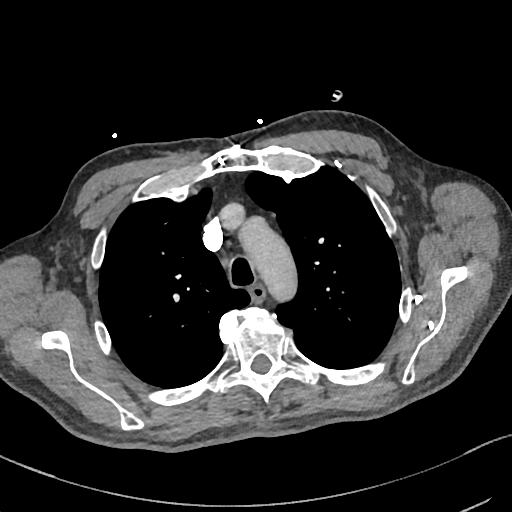
[im 352/426  lung]
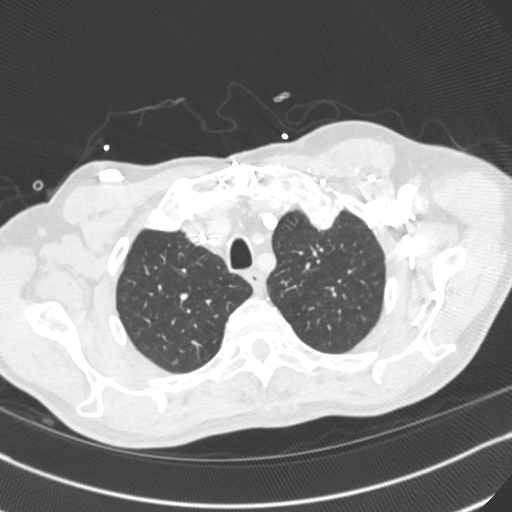
[im 370/426  soft-tissue]
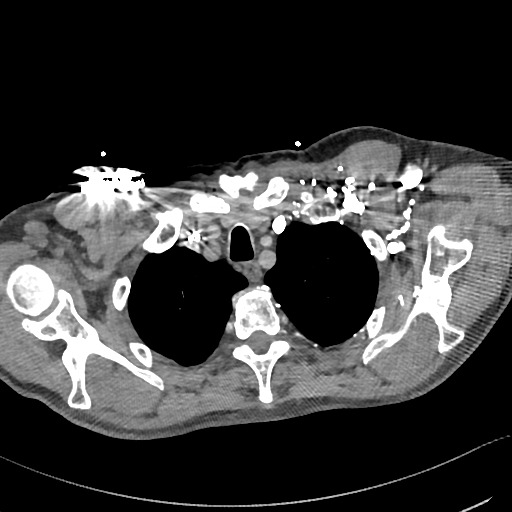
[im 407/426  lung]
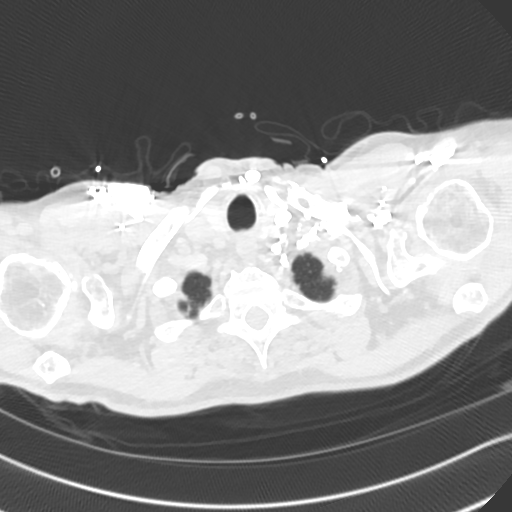

[Series 8: cor · coronal · 0.58mm/px · 3 of 128 slices shown]
[im 32/128  soft-tissue]
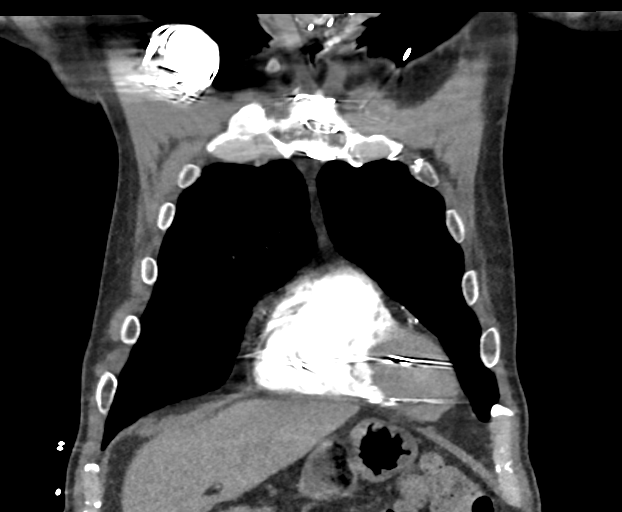
[im 64/128  soft-tissue]
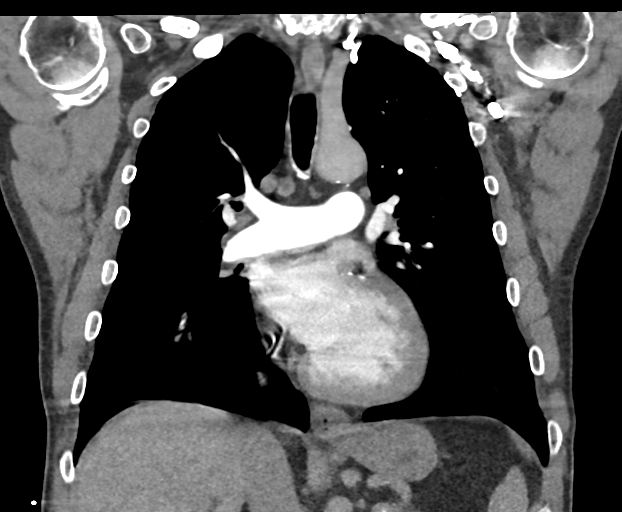
[im 96/128  soft-tissue]
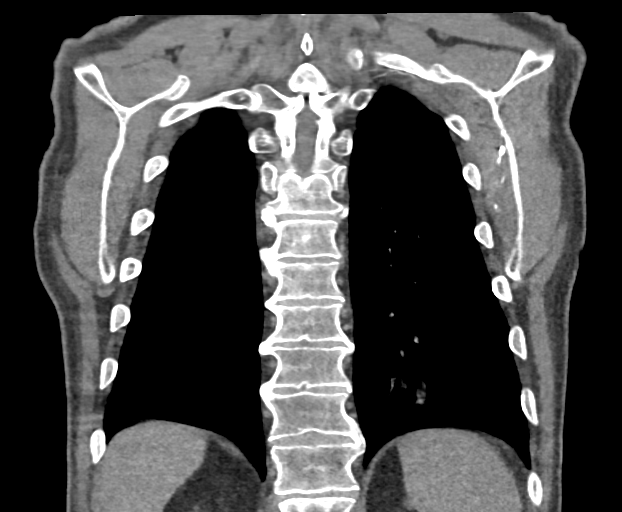

[18 of 46 positions shown; findings below may reference images not displayed]

RADIATION DOSE REDUCTION: This exam was performed according to the
departmental dose-optimization program which includes automated
exposure control, adjustment of the mA and/or kV according to
patient size and/or use of iterative reconstruction technique.

CONTRAST:  70mL OMNIPAQUE IOHEXOL 350 MG/ML SOLN
FINDINGS: Cardiovascular: No filling defects in the pulmonary arteries to
suggest pulmonary emboli. Pacer wires noted in the right heart.
Heart is borderline in size. Prior CABG. Scattered aortic
calcifications. No aneurysm.

Mediastinum/Nodes: No mediastinal, hilar, or axillary adenopathy.
Trachea and esophagus are unremarkable. Thyroid unremarkable.

Lungs/Pleura: Clustered nodular airspace disease and ground-glass
disease in the left lower lobe, likely small airways disease.
Similar clustered nodules in the right middle lobe. No effusions.

Upper Abdomen: Imaging into the upper abdomen demonstrates no acute
findings.

Musculoskeletal: Chest wall soft tissues are unremarkable. No acute
bony abnormality.

Review of the MIP images confirms the above findings.
IMPRESSION: No evidence of pulmonary embolus.

Clustered nodular densities and ground-glass densities in the left
lower lobe and right middle lobe compatible with small airways
disease.

Prior CABG.

Aortic Atherosclerosis ([id number]).

## 2023-08-28 IMAGING — CT CT PARANASAL SINUSES LIMITED
1 series · 10 of 12 positions shown, 13 images · non-contrast
Comparison: None Available.

CLINICAL DATA: Upper airway cough syndrome MUT.Y ([id number]).

EXAM:
CT PARANASAL SINUS LIMITED WITHOUT CONTRAST
TECHNIQUE: Non-contiguous multidetector CT images of the paranasal sinuses were
obtained in a single plane without contrast.
RADIATION DOSE REDUCTION: This exam was performed according to the
departmental dose-optimization program which includes automated
exposure control, adjustment of the mA and/or kV according to
patient size and/or use of iterative reconstruction technique.

[Series 3: cor soft · axial · 0.37mm/px · z∈[+30,+120]mm · 10 of 12 slices shown, 13 images]
[im 2/12  brain]
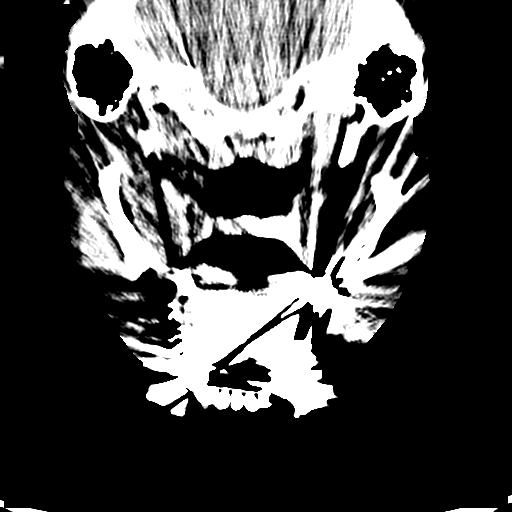
[im 2/12  bone]
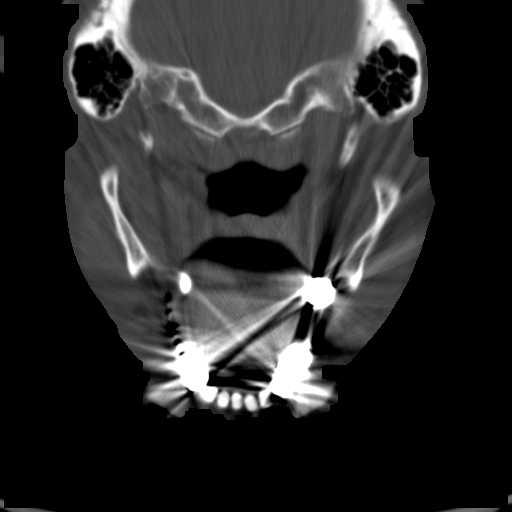
[im 3/12  bone]
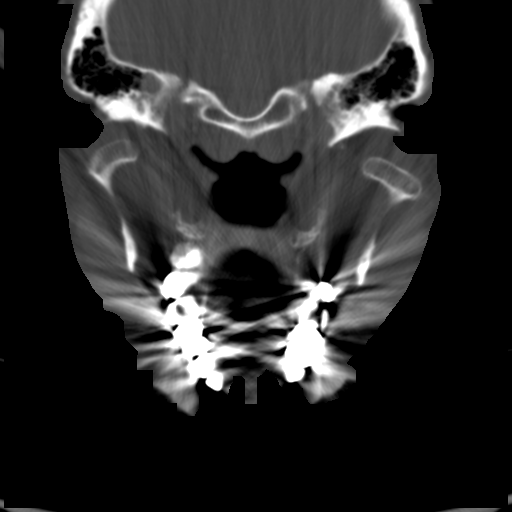
[im 4/12  bone]
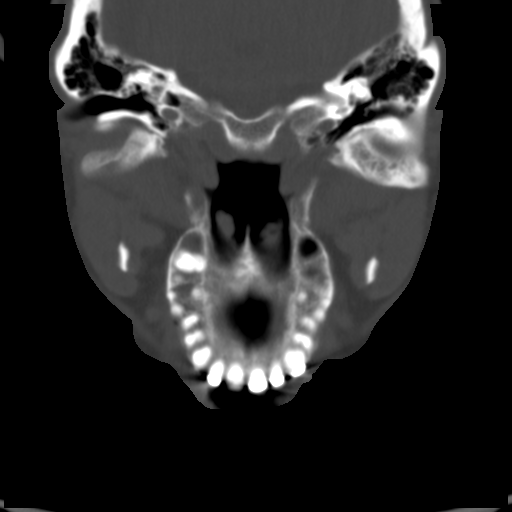
[im 5/12  bone]
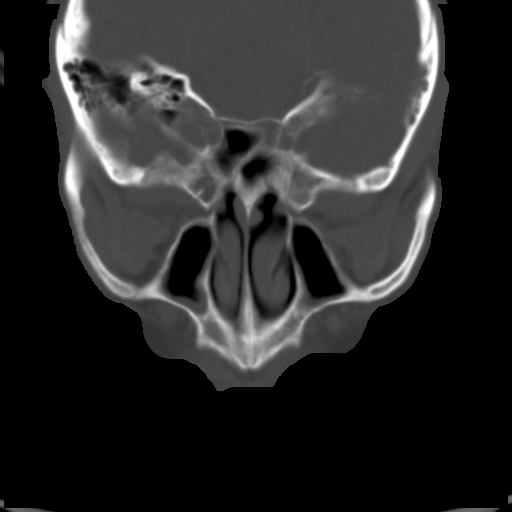
[im 6/12  brain]
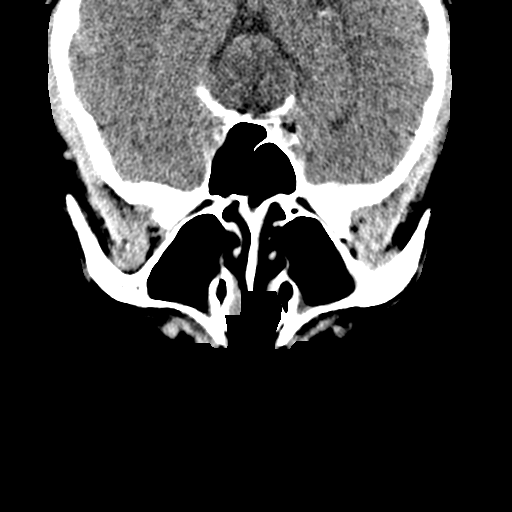
[im 6/12  bone]
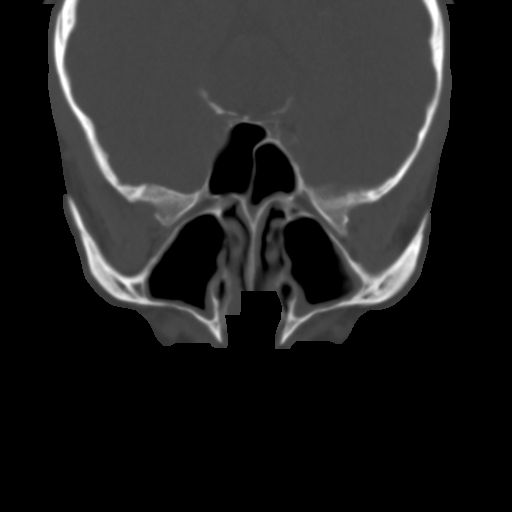
[im 7/12  bone]
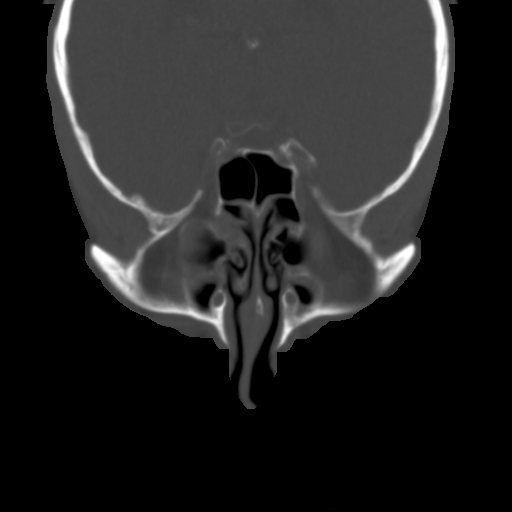
[im 8/12  bone]
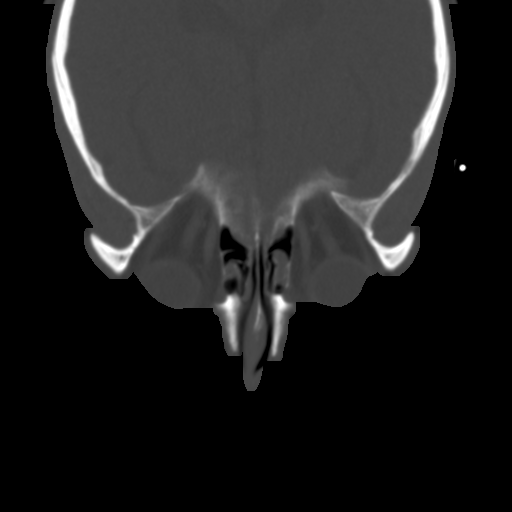
[im 9/12  bone]
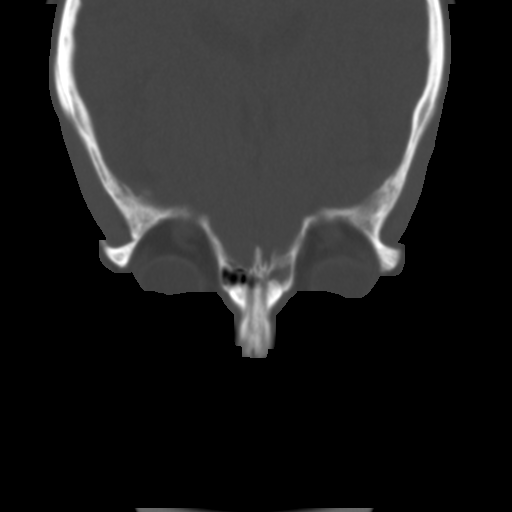
[im 10/12  brain]
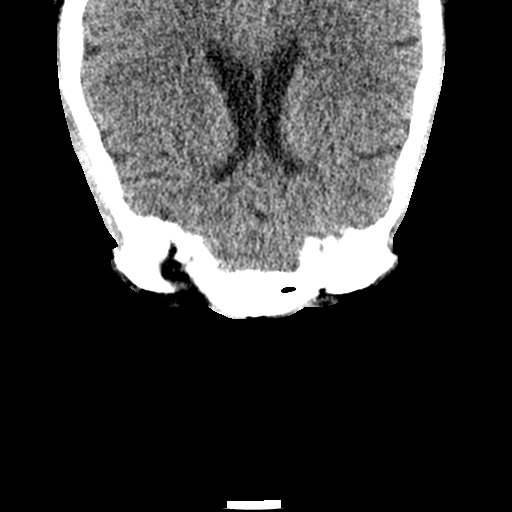
[im 10/12  bone]
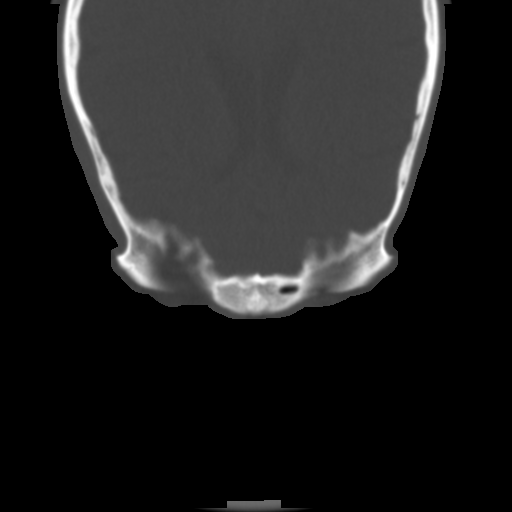
[im 11/12  bone]
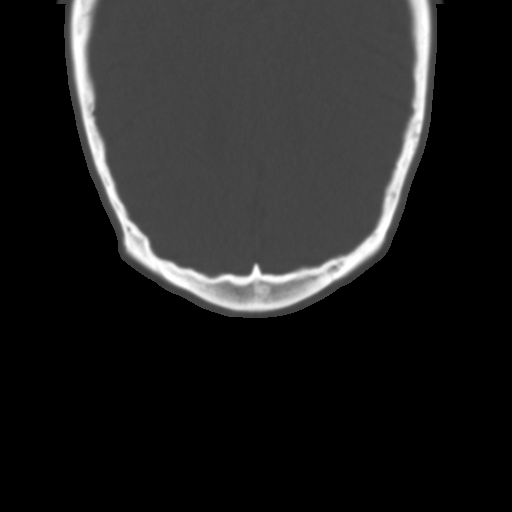

[10 of 12 positions shown; findings below may reference images not displayed]

FINDINGS: Limited paranasal sinus CT shows mild mucosal thickening of the
bilateral maxillary sinuses, sphenoid sinuses for and ethmoid cells.
Frontal sinuses are hypoplastic.

Cartilaginous portion of the nasal septum is deviated to the left.

Limited views of the intracranial compartment and orbits showed no
significant abnormality.
IMPRESSION: Mild inflammatory paranasal sinus disease.
